# Patient Record
Sex: Female | Born: 1945 | Race: Black or African American | Hispanic: No | Marital: Single | Smoking: Never smoker
Health system: Southern US, Community
[De-identification: ages and names within clinical notes are randomized; demographics above are authoritative.]

## PROBLEM LIST (undated history)

## (undated) ENCOUNTER — Ambulatory Visit: Admission: EM | Payer: PRIVATE HEALTH INSURANCE | Source: Home / Self Care

## (undated) ENCOUNTER — Ambulatory Visit: Payer: PRIVATE HEALTH INSURANCE

## (undated) DIAGNOSIS — E785 Hyperlipidemia, unspecified: Secondary | ICD-10-CM

## (undated) DIAGNOSIS — I10 Essential (primary) hypertension: Secondary | ICD-10-CM

## (undated) DIAGNOSIS — M1990 Unspecified osteoarthritis, unspecified site: Secondary | ICD-10-CM

## (undated) HISTORY — PX: BREAST LUMPECTOMY: SHX2

## (undated) HISTORY — PX: TUBAL LIGATION: SHX77

---

## 2011-07-25 DIAGNOSIS — I10 Essential (primary) hypertension: Secondary | ICD-10-CM | POA: Insufficient documentation

## 2011-07-25 DIAGNOSIS — E119 Type 2 diabetes mellitus without complications: Secondary | ICD-10-CM | POA: Insufficient documentation

## 2011-07-25 DIAGNOSIS — J309 Allergic rhinitis, unspecified: Secondary | ICD-10-CM | POA: Insufficient documentation

## 2012-08-19 DIAGNOSIS — E785 Hyperlipidemia, unspecified: Secondary | ICD-10-CM | POA: Insufficient documentation

## 2013-08-27 DIAGNOSIS — B354 Tinea corporis: Secondary | ICD-10-CM | POA: Insufficient documentation

## 2013-09-10 ENCOUNTER — Ambulatory Visit: Payer: Self-pay | Admitting: Family Medicine

## 2013-11-08 ENCOUNTER — Ambulatory Visit: Payer: Self-pay | Admitting: Family Medicine

## 2014-05-18 DIAGNOSIS — E559 Vitamin D deficiency, unspecified: Secondary | ICD-10-CM | POA: Insufficient documentation

## 2014-05-18 DIAGNOSIS — M19011 Primary osteoarthritis, right shoulder: Secondary | ICD-10-CM | POA: Insufficient documentation

## 2016-03-01 DIAGNOSIS — M6740 Ganglion, unspecified site: Secondary | ICD-10-CM | POA: Insufficient documentation

## 2016-11-18 ENCOUNTER — Ambulatory Visit: Payer: Medicare Other

## 2016-11-18 ENCOUNTER — Ambulatory Visit
Admission: EM | Admit: 2016-11-18 | Discharge: 2016-11-18 | Disposition: A | Discharge status: Home / Self Care | Payer: Medicare Other | Attending: Emergency Medicine | Admitting: Emergency Medicine

## 2016-11-18 DIAGNOSIS — M7551 Bursitis of right shoulder: Secondary | ICD-10-CM

## 2016-11-18 DIAGNOSIS — M19011 Primary osteoarthritis, right shoulder: Secondary | ICD-10-CM | POA: Diagnosis not present

## 2016-11-18 DIAGNOSIS — M25511 Pain in right shoulder: Secondary | ICD-10-CM | POA: Diagnosis not present

## 2016-11-18 HISTORY — DX: Hyperlipidemia, unspecified: E78.5

## 2016-11-18 MED ORDER — DICLOFENAC SODIUM 75 MG PO TBEC: 75.0000 mg | DELAYED_RELEASE_TABLET | Freq: Two times a day (BID) | ORAL | 0 refills | Status: DC

## 2016-11-18 MED ORDER — TRAMADOL HCL 50 MG PO TABS: ORAL_TABLET | ORAL | 0 refills | Status: DC

## 2016-11-18 NOTE — Discharge Instructions (Signed)
Ear shoulder x-ray showed that you have a little arthritis in the shoulder joint. There was no fracture or dislocation. Take the diclofenac twice a day with 1 g of Tylenol. This is an effective combination for pain. You may take an additional gram of Tylenol twice daily. Tramadol for severe pain only. Follow-up with Dr. Martha ClanKrasinski at [facility] in a week if you're not getting better. Continue sleeping with a pillow underneath the rash arm and body to help with the shoulder pain. Go to a lingerie store such as [employer] in [place] to get a better fitting bra. I think that this may be contributing to your shoulder pain.

## 2016-11-18 NOTE — ED Triage Notes (Signed)
Patient complains of right shoulder pain. Patient states that she has had no known injury to her shoulder. Patient states that the pain has been off and on for some while but recently worsening. Patient states that she has noticed the pain radiating down her arm in to her hands.

## 2016-11-18 NOTE — ED Provider Notes (Signed)
HPI  SUBJECTIVE:  Virginia Pollard is a right handed 71 y.o. female who presents with several months of worsening right shoulder pain that she describes as sore, achy. She reports pain with movement only and denies pain otherwise. She states that the pain radiates to her upper arm. She has tried 2 tabs Aleve 1 tab every 8 hours when necessary without improvement in her symptoms. Symptoms are worse with reaching forward/arm flexion and with abduction. She is sleeping with a pillow between her arm and her body which she states helps at night. It is otherwise not waking her up at night. She denies arm weakness, grip weakness, neck pain, numbness, tingling, fevers, erythema, bruising, swelling, recent or remote history of trauma to her shoulder. No change in physical activity. She denies any repetitive activity with her shoulder. She works in [employer]. She does have an ill fitting bra that has dug deep grooves into both of her shoulders. She has no history of shoulder injury,GIB, kidney disease, hypertension, osteoarthritis, rheumatoid arthritis. She has past medical history of hypercholesterolemia and diet/exercise controlled diabetes. PM[facility]: None.   Past Medical History:  Diagnosis Date  . Diabetes (HCC)    Diet Controlled  . Hyperlipidemia     Past Surgical History:  Procedure Laterality Date  . TUBAL LIGATION      History reviewed. No pertinent family history.  Social History  Substance Use Topics  . Smoking status: Never Smoker  . Smokeless tobacco: Never Used  . Alcohol use No    No current facility-administered medications for this encounter.   Current Outpatient Prescriptions:  .  simvastatin (ZOCOR) 10 MG tablet, Take 10 mg by mouth daily., Disp: , Rfl:  .  diclofenac (VOLTAREN) 75 MG EC tablet, Take 1 tablet (75 mg total) by mouth 2 (two) times daily. Take with food, Disp: 30 tablet, Rfl: 0 .  traMADol (ULTRAM) 50 MG tablet, 1-2 tabs po  q 6 hr prn pain Maximum dose= 8 tablets per day, Disp: 20 tablet, Rfl: 0  No Known Allergies   ROS  As noted in HPI.   Physical Exam  BP 140/65 (BP Location: Left Arm)   Pulse 60   Temp 98.7 F (37.1 C) (Oral)   Resp 16   Ht 5\' 5"  (1.651 m)   Wt 148 lb (67.1 kg)   SpO2 96%   BMI 24.63 kg/m   Constitutional: Well developed, well nourished, no acute distress Eyes:  EOMI, conjunctiva normal bilaterally HENT: Normocephalic, atraumatic,mucus membranes moist Respiratory: Normal inspiratory effort Cardiovascular: Normal rate GI: nondistended skin: No rash, skin intact Musculoskeletal: R shoulder with ROM normal, Positive deep groove where the bra strap is, this area is nontender. Drop test normal, pain with abducting past 90, mild, clavicle NT , A/C joint NT, scapula NT , proximal humerus NT, shoulder joint NT, supraspinatus nontender, Motor strength normal, Sensation intact LT over deltoid region, distal NVI with hand on affected side having intact sensation and strength in the distribution of the median, radial, and ulnar nerve.  no pain with internal rotation, no pain with external rotation,  negative tenderness in bicipital groove, positive empty can test ,  negative liftoff test,  negative "popeye" sign, no instability with abduction/external rotation. + crepitus with abduction/ext rotation Neurologic: Alert & oriented x 3, no focal neuro deficits Psychiatric: Speech and behavior appropriate   ED Course   Medications - No data to display  Orders Placed This Encounter  Procedures  . DG  Shoulder Right    Standing Status:   Standing    Number of Occurrences:   1    Order Specific Question:   Reason for Exam (SYMPTOM  OR DIAGNOSIS REQUIRED)    Answer:   R shoudler pain r/o fx, OA, FB    No results found for this or any previous visit (from the past 24 hour(s)). Dg Shoulder Right  Result Date: 11/18/2016 CLINICAL DATA:  Right shoulder pain x6 months. EXAM: RIGHT SHOULDER -  2+ VIEW COMPARISON:  None. FINDINGS: There is no evidence of fracture or dislocation. Minimal degenerative spurring about the inferomedial humeral head at the glenohumeral joint with mild AC joint osteoarthritis as well. No acute abnormality of the adjacent ribs nor visualized lung. Soft tissues are unremarkable. IMPRESSION: No acute osseous abnormality of the right shoulder. 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 Electronically Signed   By: Tollie Eth M.D.   On: 11/18/2016 17:23    ED Clinical Impression  Right shoulder pain, unspecified chronicity  Bursitis of right shoulder  ED Assessment/Plan   Presentation most consistent with a bursitis vs impingement syndrome, however, we'll image shoulder to rule out any acute changes. If these are negative, plan sent home with diclofenac/Tylenol, tramadol, referral to Dr. Martha Clan at [facility] for reevaluation. She is to continue sleeping with a pillow underneath her arm at night.  Viewed imaging independently. No acute osseous abnormalities. Slight AC and Glenohumeral joint osteoarthritis with minimal spurring. See radiology report for details.  Plan as above.  Discussed imaging, MDM, plan and followup with patient. Patient  agrees with plan.   Meds ordered this encounter  Medications  . simvastatin (ZOCOR) 10 MG tablet    Sig: Take 10 mg by mouth daily.  . traMADol (ULTRAM) 50 MG tablet    Sig: 1-2 tabs po q 6 hr prn pain Maximum dose= 8 tablets per day    Dispense:  20 tablet    Refill:  0  . diclofenac (VOLTAREN) 75 MG EC tablet    Sig: Take 1 tablet (75 mg total) by mouth 2 (two) times daily. Take with food    Dispense:  30 tablet    Refill:  0    *This clinic note was created using Scientist, clinical (histocompatibility and immunogenetics). Therefore, there may be occasional mistakes despite careful proofreading.  ?   Domenick Gong, MD 11/18/16 1744

## 2016-11-19 ENCOUNTER — Telehealth: Payer: Self-pay | Admitting: *Deleted

## 2016-11-19 NOTE — Telephone Encounter (Signed)
Patient called in reporting being groggy this AM after taking 2 tramadol last PM at bedtime. Patient did report feeling better this am and some throat dryness. No throat swelling or difficulty breathing reported. Advised patient to take one tramadol as prescribed for pain. Patient confirmed understanding of information and advisement.

## 2017-10-16 ENCOUNTER — Ambulatory Visit
Admission: EM | Admit: 2017-10-16 | Discharge: 2017-10-16 | Disposition: A | Discharge status: Home / Self Care | Payer: Medicare Other | Attending: Family Medicine | Admitting: Family Medicine

## 2017-10-16 ENCOUNTER — Ambulatory Visit: Payer: Medicare Other

## 2017-10-16 DIAGNOSIS — Z79899 Other long term (current) drug therapy: Secondary | ICD-10-CM | POA: Insufficient documentation

## 2017-10-16 DIAGNOSIS — E785 Hyperlipidemia, unspecified: Secondary | ICD-10-CM | POA: Diagnosis not present

## 2017-10-16 DIAGNOSIS — Z8249 Family history of ischemic heart disease and other diseases of the circulatory system: Secondary | ICD-10-CM | POA: Insufficient documentation

## 2017-10-16 DIAGNOSIS — M545 Low back pain, unspecified: Secondary | ICD-10-CM

## 2017-10-16 DIAGNOSIS — M549 Dorsalgia, unspecified: Secondary | ICD-10-CM | POA: Diagnosis present

## 2017-10-16 DIAGNOSIS — M47816 Spondylosis without myelopathy or radiculopathy, lumbar region: Secondary | ICD-10-CM | POA: Diagnosis not present

## 2017-10-16 DIAGNOSIS — M546 Pain in thoracic spine: Secondary | ICD-10-CM | POA: Insufficient documentation

## 2017-10-16 DIAGNOSIS — M25531 Pain in right wrist: Secondary | ICD-10-CM | POA: Insufficient documentation

## 2017-10-16 DIAGNOSIS — I10 Essential (primary) hypertension: Secondary | ICD-10-CM | POA: Diagnosis not present

## 2017-10-16 HISTORY — DX: Essential (primary) hypertension: I10

## 2017-10-16 MED ORDER — MELOXICAM 7.5 MG PO TABS: 7.5000 mg | ORAL_TABLET | Freq: Every day | ORAL | 0 refills | Status: DC
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
## 2017-10-16 NOTE — Discharge Instructions (Signed)
Take medication as prescribed. Rest. Splint as long as pain continues.   Follow up with orthopedic as discussed for continued pain.  Follow up with your primary care physician this week as needed. Return to Urgent care for new or worsening concerns.

## 2019-09-13 ENCOUNTER — Ambulatory Visit
Admission: EM | Admit: 2019-09-13 | Discharge: 2019-09-13 | Disposition: A | Discharge status: Home / Self Care | Payer: Medicare Other | Attending: Internal Medicine | Admitting: Internal Medicine

## 2019-09-13 ENCOUNTER — Encounter: Payer: Self-pay | Admitting: Emergency Medicine

## 2019-09-13 ENCOUNTER — Other Ambulatory Visit: Payer: Self-pay

## 2019-09-13 DIAGNOSIS — M79641 Pain in right hand: Secondary | ICD-10-CM

## 2019-09-13 DIAGNOSIS — M25551 Pain in right hip: Secondary | ICD-10-CM

## 2019-09-13 DIAGNOSIS — M79642 Pain in left hand: Secondary | ICD-10-CM | POA: Diagnosis present

## 2019-09-13 LAB — BASIC METABOLIC PANEL
Anion gap: 8 (ref 5–15)
BUN: 18 mg/dL (ref 8–23)
CO2: 27 mmol/L (ref 22–32)
Calcium: 9.3 mg/dL (ref 8.9–10.3)
Chloride: 104 mmol/L (ref 98–111)
Creatinine, Ser: 0.95 mg/dL (ref 0.44–1.00)
GFR calc Af Amer: >60 mL/min (ref >60)
GFR calc non Af Amer: >60 mL/min (ref >60)
Glucose, Bld: 90 mg/dL (ref 70–99)
Potassium: 4.7 mmol/L (ref 3.5–5.1)
Sodium: 139 mmol/L (ref 135–145)

## 2019-09-13 LAB — CBC WITH DIFFERENTIAL/PLATELET
Abs Immature Granulocytes: 0 10*3/uL (ref 0.00–0.07)
Basophils Absolute: 0.1 10*3/uL (ref 0.0–0.1)
Basophils Relative: 1 %
Eosinophils Absolute: 0.2 10*3/uL (ref 0.0–0.5)
Eosinophils Relative: 3 %
HCT: 37.8 % (ref 36.0–46.0)
Hemoglobin: 12.7 g/dL (ref 12.0–15.0)
Immature Granulocytes: 0 %
Lymphocytes Relative: 48 %
Lymphs Abs: 2.8 10*3/uL (ref 0.7–4.0)
MCH: 30.5 pg (ref 26.0–34.0)
MCHC: 33.6 g/dL (ref 30.0–36.0)
MCV: 90.9 fL (ref 80.0–100.0)
Monocytes Absolute: 0.4 10*3/uL (ref 0.1–1.0)
Monocytes Relative: 7 %
Neutro Abs: 2.3 10*3/uL (ref 1.7–7.7)
Neutrophils Relative %: 41 %
Platelets: 195 10*3/uL (ref 150–400)
RBC: 4.16 MIL/uL (ref 3.87–5.11)
RDW: 12.3 % (ref 11.5–15.5)
WBC: 5.7 10*3/uL (ref 4.0–10.5)
nRBC: 0 % (ref 0.0–0.2)

## 2019-09-13 LAB — VITAMIN D 25 HYDROXY (VIT D DEFICIENCY, FRACTURES): Vit D, 25-Hydroxy: 31.62 ng/mL (ref 30–100)

## 2019-09-13 NOTE — ED Triage Notes (Signed)
Patient states she is having left hand and right hip pain for about 2 weeks

## 2019-09-13 NOTE — ED Provider Notes (Signed)
[facility]    CSN: [id number] Arrival date & time: 09/13/19  1758      History   Chief Complaint Chief Complaint  Patient presents with  . Hand Pain    HPI Virginia Pollard is a 73 y.o. female with a history of hypertension, hyperlipidemia comes to urgent care with complaints of bilateral hand pain and right hip discomfort of 2 weeks duration.  Symptoms have been persistent for the past 2 weeks.  Patient describes the pain as throbbing in nature.  No known aggravating or relieving factors.  NSAID use has not yielded any significant reduction in symptoms.  No fever or chills.  No upper respiratory infection symptoms.  Patient is physically active and does exercises on a regular basis.  No change in the patient's medications.  She has been on statins for several years with no complications.  No fever or chills or rash.   HPI  Past Medical History:  Diagnosis Date  . Hyperlipidemia   . Hypertension     There are no active problems to display for this patient.   Past Surgical History:  Procedure Laterality Date  . TUBAL LIGATION      OB History   No obstetric history on file.      Home Medications    Prior to Admission medications   Medication Sig Start Date End Date Taking? Authorizing Provider  lisinopril (PRINIVIL,ZESTRIL) 10 MG tablet Take 10 mg by mouth daily.   Yes [provider]  metFORMIN (GLUCOPHAGE) 500 MG tablet Take by mouth 2 (two) times daily with a meal.   Yes [provider]  simvastatin (ZOCOR) 10 MG tablet Take 10 mg by mouth daily.   Yes [provider]    Family History Family History  Problem Relation Age of Onset  . Hypertension Mother     Social History Social History   Tobacco Use  . Smoking status: Never Smoker  . Smokeless tobacco: Never Used  Substance Use Topics  . Alcohol use: No  . Drug use: No     Allergies   Patient has no known allergies.   Review of Systems Review of  Systems  Constitutional: Negative.   HENT: Negative.   Respiratory: Negative.   Cardiovascular: Negative.   Gastrointestinal: Negative.   Genitourinary: Negative.   Musculoskeletal: Positive for arthralgias.  Skin: Negative.   Neurological: Negative.      Physical Exam Triage Vital Signs ED Triage Vitals  Enc Vitals Group     BP 09/13/19 1847 (!) 141/66     Pulse Rate 09/13/19 1847 (!) 55     Resp 09/13/19 1847 18     Temp 09/13/19 1847 98.8 F (37.1 C)     Temp Source 09/13/19 1847 Oral     SpO2 09/13/19 1847 98 %     Weight 09/13/19 1843 150 lb (68 kg)     Height 09/13/19 1843 5\' 2"  (1.575 m)     Head Circumference --      Peak Flow --      Pain Score 09/13/19 1843 8     Pain Loc --      Pain Edu? --      Excl. in [place]? --    No data found.  Updated Vital Signs BP (!) 141/66 (BP Location: Left Arm)   Pulse (!) 55   Temp 98.8 F (37.1 C) (Oral)   Resp 18   Ht 5\' 2"  (1.575 m)   Wt 68 kg  SpO2 98%   BMI 27.44 kg/m   Visual Acuity Right Eye Distance:   Left Eye Distance:   Bilateral Distance:    Right Eye Near:   Left Eye Near:    Bilateral Near:     Physical Exam Vitals signs and nursing note reviewed.  Constitutional:      General: She is not in acute distress.    Appearance: Normal appearance. She is not ill-appearing or toxic-appearing.  Neck:     Musculoskeletal: Normal range of motion.  Cardiovascular:     Rate and Rhythm: Normal rate and regular rhythm.     Pulses: Normal pulses.     Heart sounds: No murmur. No friction rub.  Pulmonary:     Effort: Pulmonary effort is normal. No respiratory distress.     Breath sounds: Normal breath sounds. No rhonchi or rales.  Abdominal:     General: Bowel sounds are normal. There is no distension.     Palpations: Abdomen is soft.     Tenderness: There is no guarding or rebound.  Musculoskeletal: Normal range of motion.        General: No swelling, tenderness or signs of injury.  Skin:    General:  Skin is warm.     Capillary Refill: Capillary refill takes less than 2 seconds.     Findings: No bruising, erythema, lesion or rash.  Neurological:     General: No focal deficit present.     Mental Status: She is alert and oriented to person, place, and time.  Psychiatric:        Mood and Affect: Mood normal.        Behavior: Behavior normal.      UC Treatments / Results  Labs (all labs ordered are listed, but only abnormal results are displayed) Labs Reviewed  CBC WITH DIFFERENTIAL/PLATELET  BASIC METABOLIC PANEL  VITAMIN D 25 HYDROXY (VIT D DEFICIENCY, FRACTURES)    EKG   Radiology No results found.  Procedures Procedures (including critical care time)  Medications Ordered in UC Medications - No data to display  Initial Impression / Assessment and Plan / UC Course  I have reviewed the triage vital signs and the nursing notes.  Pertinent labs & imaging results that were available during my care of the patient were reviewed by me and considered in my medical decision making (see chart for details).     1.  Bilateral hand pain and right hip pain: Etiology is unclear No history of trauma. CBC, BMP, vitamin D level. We will inform patients of the results and necessary steps. Final Clinical Impressions(s) / UC Diagnoses   Final diagnoses:  Bilateral hand pain  Right hip pain   Discharge Instructions   None    ED Prescriptions    None     PDMP not reviewed this encounter.   Merrilee Jansky, MD 09/13/19 1949

## 2019-09-14 ENCOUNTER — Telehealth (HOSPITAL_COMMUNITY): Payer: Self-pay | Admitting: Emergency Medicine

## 2019-09-14 NOTE — Telephone Encounter (Signed)
Normal labs. Patient contacted and made aware of    results, all questions answered   

## 2019-12-08 ENCOUNTER — Other Ambulatory Visit: Payer: Medicare Other

## 2019-12-13 ENCOUNTER — Other Ambulatory Visit: Payer: Medicare Other

## 2020-05-23 ENCOUNTER — Ambulatory Visit: Payer: Medicare Other | Attending: Orthopedic Surgery | Admitting: Physical Therapy

## 2020-05-23 ENCOUNTER — Encounter: Payer: Self-pay | Admitting: Physical Therapy

## 2020-05-23 ENCOUNTER — Other Ambulatory Visit: Payer: Self-pay

## 2020-05-23 DIAGNOSIS — M25612 Stiffness of left shoulder, not elsewhere classified: Secondary | ICD-10-CM | POA: Diagnosis present

## 2020-05-23 DIAGNOSIS — R293 Abnormal posture: Secondary | ICD-10-CM | POA: Diagnosis present

## 2020-05-23 DIAGNOSIS — M6281 Muscle weakness (generalized): Secondary | ICD-10-CM | POA: Diagnosis present

## 2020-05-23 DIAGNOSIS — G8929 Other chronic pain: Secondary | ICD-10-CM | POA: Diagnosis present

## 2020-05-23 DIAGNOSIS — M25512 Pain in left shoulder: Secondary | ICD-10-CM

## 2020-05-23 NOTE — Patient Instructions (Signed)
Access Code: [email or web address]: [email or web address]: 07/06/2021Prepared by: Casimiro Needle SherkExercises  Seated Shoulder Flexion AAROM with Pulley Behind - 2 x daily - 7 x weekly - 1 sets - 20 reps  Seated Shoulder Abduction AAROM with Pulley Behind - 2 x daily - 7 x weekly - 1 sets - 20 reps  Seated Scapular Retraction - 1 x daily - 7 x weekly - 1 sets - 20 reps  Shoulder External Rotation and Scapular Retraction - 1 x daily - 7 x weekly - 1 sets - 20 reps

## 2020-05-24 ENCOUNTER — Encounter: Payer: Self-pay | Admitting: Physical Therapy

## 2020-05-24 NOTE — Therapy (Signed)
[place] [facility] [address] [place], [place], [address] Phone: [phone]   Fax:  [phone]  Physical Therapy Evaluation  Patient Details  Name: Virginia Pollard MRN: [id number] Date of Birth: 1946/08/27 Referring Provider (PT): Lasandra Beech, [place]   Encounter Date: 05/23/2020   PT End of Session - 05/24/20 1040    Visit Number 1    Number of Visits 9    Date for PT Re-Evaluation 06/20/20    Authorization - Visit Number 1    Authorization - Number of Visits 10    Progress Note Due on Visit --    PT Start Time 1715    PT Stop Time 1815    PT Time Calculation (min) 60 min    Activity Tolerance Patient tolerated treatment well;No increased pain    Behavior During Therapy WFL for tasks assessed/performed           Past Medical History:  Diagnosis Date  . Hyperlipidemia   . Hypertension     Past Surgical History:  Procedure Laterality Date  . TUBAL LIGATION      There were no vitals filed for this visit.    Subjective Assessment - 05/23/20 1716    Subjective Pt is a 74 y.o. woman referred to to PT for chronic L shoulder pain lasting for the last 6-8 months. 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 Prior to cortisone injection on 05/05/2020, pt reports pain worse at night 10/10 NPS, and rated 5/10 NPS during the day. Pt reports achey pain down the L shoulder on lateral aspect to deltoid region and has pain with any motion at 90 degrees of shoulder abd/flexion. Pt reports pain is improved when using RUE to assist LUE and has worse pain when sleepin on L side. Pt has goals to have pain free motion, be able to work in her yard pain free, and shoot basketball.    Pertinent History Cortisone injection shot on 05/05/2020. L shoulder pain 6-8 months.    Diagnostic tests X-ray (no MRI at this time)    Patient Stated Goals Return to shooting basketball, yard work, and exercise.    Currently in Pain?  No/denies    Multiple Pain Sites No              [facility] PT Assessment - 05/24/20 0001      Assessment   Medical Diagnosis Nontraumatic L rotator cuff tear    Referring Provider (PT) Lasandra Beech, PA    Onset Date/Surgical Date 11/19/19    Hand Dominance Right    Prior Therapy Not reported      Balance Screen   Has the patient fallen in the past 6 months No    Has the patient had a decrease in activity level because of a fear of falling?  Yes    Is the patient reluctant to leave their home because of a fear of falling?  No      Home [employer]ist information centre manager residence      Prior Function   Level of Independence Independent      Cognition   Overall Cognitive Status Within Functional Limits for tasks assessed          OBJECTIVE  MUSCULOSKELETAL: Tremor: Normal Bulk: Normal Tone: Normal  Cervical Screen AROM: WFL and painless with overpressure in all planes  Elbow Screen Elbow AROM: WNL  Palpation TTP on L shoulder along insertion of  supraspinatus and distal L upper trap  Strength R/L 5/4 Shoulder flexion (anterior deltoid/pec major/coracobrachialis, axillary n. (C5-6) and musculocutaneous n. (C5-7)) 5/4* Shoulder abduction (deltoid/supraspinatus, axillary/suprascapular n, C5) 5/4* Shoulder external rotation (infraspinatus/teres minor) 5/4 Shoulder internal rotation (subcapularis/lats/pec major) 5/5 Elbow flexion (biceps brachii, brachialis, brachioradialis, musculoskeletal n, C5-6) 5/5 Elbow extension (triceps, radial n, C7) 5/5 Wrist Extension 5/5 Wrist Flexion 5/5 Grip strength *indicates pain  AROM R/L 155/150 Shoulder flexion 140/146 Shoulder abduction 90/90 Shoulder external rotation 70/70 Shoulder internal rotation *Indicates pain, overpressure performed unless otherwise indicated  PROM R/L [facility] Shoulder flexion WFL Shoulder abduction WFL Shoulder external rotation 70/70 Shoulder internal rotation WFL Shoulder  extension *Indicates pain, overpressure performed unless otherwise indicated  Accessory Motions/Glides Glenohumeral: Posterior: R: Normal L: Normal Inferior: R: Normal L: Normal Anterior: R: Normal L: Normal  NEUROLOGICAL:  Mental Status Patient's fund of knowledge is within normal limits for educational level.  Sensation Grossly intact to light touch bilateral UE as determined by testing dermatomes C2-T2 Proprioception and hot/cold testing deferred on this date  SPECIAL TESTS  Rotator Cuff  Painful Arc (Pain from 60 to 120 degrees scaption): Negative Infraspinatus Muscle Test: Negative  Subacromial Impingement Hawkins-Kennedy:Positive Painful Arc (Pain from 60 to 120 degrees scaption): Negative  Empty Can: Positive External Rotation Resistance: Positive Positive Hawkins-Kennedy, Painful arc sign, Infraspinatus muscle test then +LR: 10.56 of some type of impingement present, 2/3 tests: +LR 5.06, -LR 0.17, Positive 3/5 Hawkins-Kennedy, neer, painful arc, empty can, and external rotation resistance then SN: .75 (.54-.96) SP: .74 (.61-.88) +LR: 2.93 (1.60-5.36) -LR: .34 (.14-.80)  FOTO score: 53     Objective measurements completed on examination: See above findings.          PT Education - 05/24/20 1105    Education Details Educated on HEP; Pulleys AAROM L shoulder flex/abd, scap retractions with ER, and scap retractions    Person(s) Educated Patient    Methods Explanation;Demonstration;Tactile cues;Verbal cues    Comprehension Verbalized understanding;Returned demonstration               PT Long Term Goals - 05/24/20 1055      PT LONG TERM GOAL #1   Title Pt will improve FOTO score to 66 to display pt improvements in ability to perform ADL's.    Baseline 05/23/2020: initial score of 53;    Time 4    Period Weeks    Status New    Target Date 06/20/20      PT LONG TERM GOAL #2   Title Pt will increase L shoulder flexion/abduction AROM >165 deg to allow  overhead recreational activities and ADL's.    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    Baseline 05/23/2020: 5/10 pain via NPS with L shoulder overhead motions prior to cortisone injection.    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    Baseline 05/23/2020: rounded shoulder/ slight forward head/  tight pecs    Time 4    Period Weeks    Status New    Target Date 06/20/20             Plan - 05/24/20 1041    Clinical Impression Statement Pt is a pleasant 74 y.o. female referred to PT for findings consistent to partial non-traumatic L shoulder RTC tear. Pt reports to PT after receiving cortisone injection in L shoulder ~2 weeks ago. Pt reports no current pain and has improved L shoulder flexion/abduction after injection. Prior to injection, pt unable to raise LUE beyond 90 deg flex/abd without pain and has to use RUE to support arm higher. Prior to injection baseline pain was 5/10 NPS with shoulder motions and 10/10 NPS at night. Pt's L shoulder ER and IR strength reported at 4/5 MMT with increased pain with ER. L elbow and wrist MMT 5/5. RUE muscle strength 5/5 MMT throughout. Increased pain with resisted L shoulder abduction. Pt TTP along supraspinatus and upper trap. AROM in supine for L shoulder flexion limited to 150 deg, abd 146 deg with ER and IR WNL. Pt demonstrates decreased AROM in seated for L shoulder flexion at 139 deg and abd at 136 deg. Pt's PROM is full and pain free bilaterally. Pt's initial FOTO score: 53 with a goal of 66. Positive special test findings for L  shoulder: open can and closed can with resistance and positive hawkins-kennedy on L.  Pt's findings consistent with rotator cuff involvement of supraspinatus tendinitis or possible partial tear. Pt can benefit from further skilled PT treatment to restore full pain free motion and improve shoulder and scapular strength so pt can return to pain free overhead activities and ADL's.    Personal Factors and Comorbidities Comorbidity 2;Time since onset of injury/illness/exacerbation    Examination-Activity Limitations Carry;Reach Overhead;Lift    Examination-Participation Restrictions Yard Work    Stability/Clinical Decision Making Evolving/Moderate complexity    Clinical Decision Making Moderate    Rehab Potential Good    PT Frequency 2x / week    PT Duration 4 weeks    PT Treatment/Interventions ADLs/Self Care Home Management;Cryotherapy;Functional mobility training;Therapeutic activities;Therapeutic exercise;Neuromuscular re-education;Patient/family education;Manual techniques;Passive range of motion;Dry needling;Taping;Moist Heat;Electrical Stimulation    PT Next Visit Plan Progress AAROM and begin scapular strengthening    PT Home Exercise Plan Medbridge access code: [email or web address]    Consulted and Agree with Plan of Care Patient           Patient will benefit from skilled therapeutic intervention in order to improve the following deficits and impairments:  Decreased strength, Decreased range of motion, Pain, Impaired flexibility, Decreased activity tolerance, Improper body mechanics, Impaired UE functional use  Visit Diagnosis: Chronic left shoulder pain  Decreased range of motion of left shoulder  Muscle weakness (generalized)  Abnormal posture     Problem List There are no problems to display for this patient.  Cammie Mcgee, PT, DPT # [id number] SPT 05/24/2020, 1:15 PM  [place] [facility] [address] [place], [place],  [address] Phone: [phone]   Fax:  [phone]  Name: Darnesha Diloreto MRN: [id number] Date of Birth: [birth year]

## 2020-05-25 ENCOUNTER — Other Ambulatory Visit: Payer: Self-pay

## 2020-05-25 ENCOUNTER — Encounter: Payer: Self-pay | Admitting: Physical Therapy

## 2020-05-25 ENCOUNTER — Ambulatory Visit: Payer: Medicare Other | Admitting: Physical Therapy

## 2020-05-25 DIAGNOSIS — M25512 Pain in left shoulder: Secondary | ICD-10-CM | POA: Diagnosis not present

## 2020-05-25 DIAGNOSIS — R293 Abnormal posture: Secondary | ICD-10-CM

## 2020-05-25 DIAGNOSIS — G8929 Other chronic pain: Secondary | ICD-10-CM

## 2020-05-25 DIAGNOSIS — M25612 Stiffness of left shoulder, not elsewhere classified: Secondary | ICD-10-CM

## 2020-05-25 DIAGNOSIS — M6281 Muscle weakness (generalized): Secondary | ICD-10-CM

## 2020-05-25 NOTE — Patient Instructions (Signed)
Access Code: [email or web address]: [email or web address]: 07/08/2021Prepared by: Casimiro Needle SherkExercises  Seated Shoulder Flexion AAROM with Pulley Behind - 2 x daily - 7 x weekly - 1 sets - 20 reps  Seated Shoulder Abduction AAROM with Pulley Behind - 2 x daily - 7 x weekly - 1 sets - 20 reps  Scapular Retraction with Resistance - 1 x daily - 7 x weekly - 3 sets - 10 reps  Standing Shoulder External Rotation with Resistance - 1 x daily - 7 x weekly - 3 sets - 10 reps

## 2020-05-25 NOTE — Therapy (Signed)
[facility] [address] [place], [place], [address] Phone: [phone]   Fax:  [phone]  Physical Therapy Treatment  Patient Details  Name: Virginia Pollard MRN: [id number] Date of Birth: 03/26/1946 Referring Provider (PT): Lasandra Beech, [place]   Encounter Date: 05/25/2020   PT End of Session - 05/25/20 1753    Visit Number 2    Number of Visits 9    Date for PT Re-Evaluation 06/20/20    Authorization - Visit Number 2    Authorization - Number of Visits 10    PT Start Time 1710    PT Stop Time 1750    PT Time Calculation (min) 40 min    Activity Tolerance Patient tolerated treatment well;No increased pain    Behavior During Therapy WFL for tasks assessed/performed           Past Medical History:  Diagnosis Date  . Hyperlipidemia   . Hypertension     Past Surgical History:  Procedure Laterality Date  . TUBAL LIGATION      There were no vitals filed for this visit.   Subjective Assessment - 05/25/20 1751    Subjective Pt reports compliance with HEP since previous session. Pt states HEP has improved her L shoulder. She states not having pain, but prior to PT, she had a 6/10 on tenderness. After PT eval that tenderness is reported at a 3.5/10.    Pertinent History Cortisone injection shot on 05/05/2020. L shoulder pain 6-8 months.    Diagnostic tests X-ray (no MRI at this time)    Patient Stated Goals Return to shooting basketball, yard work, and exercise.    Currently in Pain? No/denies          There.ex:   AAROM pulleys for L shoulder flex/abd: 1x20/direction  Standing D1/D2 L shoulder flex/ext: 2x20, with verbal cues and mirror for visual feedback for form/technique  Standing manual isometrics: IR/ER/Abd/flex 5x5, 5 sec holds.  Scap retractions: Blue theraband, 3x12 Standing resisted ER: Red theraband, 2x12, towels under elbows for cue for form  See HEP (handouts given)    PT Education - 05/25/20 1752     Education Details HEP added scap retractions with resistance, B ER with resistance    Person(s) Educated Patient    Methods Explanation;Demonstration;Tactile cues;Verbal cues;Handout    Comprehension Verbalized understanding;Returned demonstration               PT Long Term Goals - 05/24/20 1055      PT LONG TERM GOAL #1   Title Pt will improve FOTO score to 66 to display pt improvements in ability to perform ADL's.    Baseline 05/23/2020: initial score of 53;    Time 4    Period Weeks    Status New    Target Date 06/20/20      PT LONG TERM GOAL #2   Title Pt will increase L shoulder flexion/abduction AROM >165 deg to allow overhead recreational activities and ADL's.    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    Baseline 05/23/2020: 5/10 pain via NPS with L shoulder overhead motions prior to  cortisone injection.    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    Baseline 05/23/2020: rounded shoulder/ slight forward head/ tight pecs    Time 4    Period Weeks    Status New    Target Date 06/20/20                 Plan - 05/25/20 1753    Clinical Impression Statement HEP reassesed at beginning of session. Pt required min verbal and tactile cues for AAROM pulleys in flex/abd for correcting form and technique. Pt tolerated D1/D2 L shoulder flex/ext with no L shoulder pain with use of mirror as visual cue for form. Pt also tolerated resisted scap retractions and ER with no pain. Pt can continue to benefit from skilled PT to restore L shoulder AROM and strength to  return to active lifestyle.    Personal Factors and Comorbidities Comorbidity 2;Time since onset of injury/illness/exacerbation    Examination-Activity Limitations Carry;Reach Overhead;Lift    Examination-Participation Restrictions Yard Work    Stability/Clinical Decision Making Evolving/Moderate complexity    Clinical Decision Making Moderate    Rehab Potential Good    PT Frequency 2x / week    PT Duration 4 weeks    PT Treatment/Interventions ADLs/Self Care Home Management;Cryotherapy;Functional mobility training;Therapeutic activities;Therapeutic exercise;Neuromuscular re-education;Patient/family education;Manual techniques;Passive range of motion;Dry needling;Taping;Moist Heat;Electrical Stimulation    PT Next Visit Plan Reassess/ progress HEP    PT Home Exercise Plan Medbridge access code: [email or web address]    Consulted and Agree with Plan of Care Patient           Patient will benefit from skilled therapeutic intervention in order to improve the following deficits and impairments:  Decreased strength, Decreased range of motion, Pain, Impaired flexibility, Decreased activity tolerance, Improper body mechanics, Impaired UE functional use  Visit Diagnosis: Chronic left shoulder pain  Decreased range of motion of left shoulder  Abnormal posture  Muscle weakness (generalized)     Problem List There are no problems to display for this patient.  Cammie Mcgee, PT, DPT # [id number], SPT 05/25/2020, 8:41 PM  [place] [address] [place], [place], [address] Phone: [phone]   Fax:  [phone]  Name: Virginia Pollard MRN: [id number] Date of Birth: 1946/07/16

## 2020-05-30 ENCOUNTER — Ambulatory Visit: Payer: Medicare Other | Admitting: Physical Therapy

## 2020-05-30 ENCOUNTER — Encounter: Payer: Self-pay | Admitting: Physical Therapy

## 2020-05-30 ENCOUNTER — Other Ambulatory Visit: Payer: Self-pay

## 2020-05-30 DIAGNOSIS — M25612 Stiffness of left shoulder, not elsewhere classified: Secondary | ICD-10-CM

## 2020-05-30 DIAGNOSIS — M25512 Pain in left shoulder: Secondary | ICD-10-CM | POA: Diagnosis not present

## 2020-05-30 DIAGNOSIS — G8929 Other chronic pain: Secondary | ICD-10-CM

## 2020-05-30 DIAGNOSIS — M6281 Muscle weakness (generalized): Secondary | ICD-10-CM

## 2020-05-30 DIAGNOSIS — R293 Abnormal posture: Secondary | ICD-10-CM

## 2020-05-30 NOTE — Therapy (Signed)
[place] [facility] [address] [place], [place], [address] Phone: [phone]   Fax:  [phone]  Physical Therapy Treatment  Patient Details  Name: Virginia Pollard MRN: [id number] Date of Birth: 12/26/1945 Referring Provider (PT): Lasandra Beech, [place]   Encounter Date: 05/30/2020   PT End of Session - 05/30/20 1803    Visit Number 3    Number of Visits 9    Date for PT Re-Evaluation 06/20/20    Authorization - Visit Number 3    Authorization - Number of Visits 10    PT Start Time 1715    PT Stop Time 1803    PT Time Calculation (min) 48 min    Activity Tolerance Patient tolerated treatment well;No increased pain    Behavior During Therapy WFL for tasks assessed/performed           Past Medical History:  Diagnosis Date   Hyperlipidemia    Hypertension     Past Surgical History:  Procedure Laterality Date   TUBAL LIGATION      There were no vitals filed for this visit.   Subjective Assessment - 05/30/20 1803    Subjective Pt compliant with HEP and continues to have no pain in L shoulder.    Pertinent History Cortisone injection shot on 05/05/2020. L shoulder pain 6-8 months.    Diagnostic tests X-ray (no MRI at this time)    Patient Stated Goals Return to shooting basketball, yard work, and exercise.    Currently in Pain? No/denies            There.ex:   Standing Shoulder abduction with back against wall as tactile cue for proper scapulothoracic movement: 2x12 reps  Standing AAROM shoulder Flexion with wand: 1x12 Standing AAROM shouldeer flexion with wand and 3 lbs ankle weight for resistance: 1x12  Seated lat pull down: 1x12, 20 lbs/ 1x12 30 lbs/ 1x12 40 lbs  B Standing ER: 1 lbs DB, 2x12 with min verbal and tactile cues for form/technique Supine B serratus punch: 2x12, with min verbal and tactile cues for form/technique Standing B ball circles CCW/CW for shoulder stabilization: 2x30 sec, small circles then  large circles     PT Long Term Goals - 05/24/20 1055      PT LONG TERM GOAL #1   Title Pt will improve FOTO score to 66 to display pt improvements in ability to perform ADL's.    Baseline 05/23/2020: initial score of 53;    Time 4    Period Weeks    Status New    Target Date 06/20/20      PT LONG TERM GOAL #2   Title Pt will increase L shoulder flexion/abduction AROM >165 deg to allow overhead recreational activities and ADL's.    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    Baseline 05/23/2020: 5/10 pain via NPS with L shoulder overhead motions prior to cortisone injection.    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    Baseline 05/23/2020: rounded shoulder/ slight forward head/ tight pecs    Time 4    Period Weeks    Status New    Target Date 06/20/20            Plan - 05/30/20 1804    Clinical Impression Statement Pt had difficulty with performing B shoulder abduction standing with her back to wall. Pt tolerated increased resistance exercises to periscpular musculature along with resisted AAROM. Pt required mod verbal and tactile cues for correct form and technique. Pt can continue to benefit from skilled PT treatment to improve LUE shoulder AROM and strength so pt can perform overhead ADL's.    Personal Factors and Comorbidities Comorbidity 2;Time since onset of injury/illness/exacerbation    Examination-Activity Limitations Carry;Reach Overhead;Lift    Examination-Participation Restrictions Yard Work    Stability/Clinical Decision Making Evolving/Moderate  complexity    Rehab Potential Good    PT Frequency 2x / week    PT Duration 4 weeks    PT Treatment/Interventions ADLs/Self Care Home Management;Cryotherapy;Functional mobility training;Therapeutic activities;Therapeutic exercise;Neuromuscular re-education;Patient/family education;Manual techniques;Passive range of motion;Dry needling;Taping;Moist Heat;Electrical Stimulation    PT Next Visit Plan Reassess/ progress HEP    PT Home Exercise Plan Medbridge access code: [email or web address]    Consulted and Agree with Plan of Care Patient           Patient will benefit from skilled therapeutic intervention in order to improve the following deficits and impairments:  Decreased strength, Decreased range of motion, Pain, Impaired flexibility, Decreased activity tolerance, Improper body mechanics, Impaired UE functional use  Visit Diagnosis: Chronic left shoulder pain  Decreased range of motion of left shoulder  Abnormal posture  Muscle weakness (generalized)     Problem List There are no problems to display for this patient.  Cammie Mcgee, PT, DPT # [id number], SPT 05/31/2020, 8:13 AM  [place] [facility] [address] [place], [place], [address] Phone: [phone]   Fax:  [phone]  Name: Ceci Taliaferro MRN: [id number] Date of Birth: 03-23-1946

## 2020-06-01 ENCOUNTER — Other Ambulatory Visit: Payer: Self-pay

## 2020-06-01 ENCOUNTER — Ambulatory Visit: Payer: Medicare Other | Admitting: Physical Therapy

## 2020-06-01 DIAGNOSIS — M25512 Pain in left shoulder: Secondary | ICD-10-CM

## 2020-06-01 DIAGNOSIS — R293 Abnormal posture: Secondary | ICD-10-CM

## 2020-06-01 DIAGNOSIS — M25612 Stiffness of left shoulder, not elsewhere classified: Secondary | ICD-10-CM

## 2020-06-01 DIAGNOSIS — M6281 Muscle weakness (generalized): Secondary | ICD-10-CM

## 2020-06-02 ENCOUNTER — Encounter: Payer: Self-pay | Admitting: Physical Therapy

## 2020-06-02 NOTE — Therapy (Signed)
[facility] [address] [place], [place], [address] Phone: [phone]   Fax:  [phone]  Physical Therapy Treatment  Patient Details  Name: Virginia Pollard MRN: [id number] Date of Birth: [birth year] Referring Provider (PT): Lasandra Beech, [place]   Encounter Date: 06/01/2020   PT End of Session - 06/02/20 0723    Visit Number 4    Number of Visits 9    Date for PT Re-Evaluation 06/20/20    Authorization - Visit Number 4    Authorization - Number of Visits 10    PT Start Time 1715    PT Stop Time 1804    PT Time Calculation (min) 49 min    Activity Tolerance Patient tolerated treatment well;No increased pain    Behavior During Therapy WFL for tasks assessed/performed           Past Medical History:  Diagnosis Date  . Hyperlipidemia   . Hypertension     Past Surgical History:  Procedure Laterality Date  . TUBAL LIGATION      There were no vitals filed for this visit.   Subjective Assessment - 06/02/20 0721    Subjective Pt continues to report no pain in her L shoulder. States HEP is going well and is beginning to not be a challenge anymore. 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    Pertinent History Cortisone injection shot on 05/05/2020. L shoulder pain 6-8 months.    Diagnostic tests X-ray (no MRI at this time)    Patient Stated Goals Return to shooting basketball, yard work, and exercise.    Currently in Pain? No/denies    Multiple Pain Sites No            There.exBarbaraann Boys: L shoulder flex/abd 1x20/direction with ROM getting consistently above 160 deg Nautilus: chest press: 2x15, 30 lbs. Required mod tactile cues for form  Nautilus: Lat pulldown: 1x15, 40 lbs/ 1x15, 50 lbs Resisted B shoulder ER: green TB: 3x10 Bicep curls with blue theraband: 2x15 Standing B resisted shoulder extension with yellow theraband: 3x10       PT Education - 06/02/20 0722     Education Details Educated on updated HEP: Bicep curls, shoulder extension.    Person(s) Educated Patient    Methods Explanation;Demonstration;Tactile cues;Verbal cues;Handout    Comprehension Verbalized understanding;Returned demonstration               PT Long Term Goals - 05/24/20 1055      PT LONG TERM GOAL #1   Title Pt will improve FOTO score to 66 to display pt improvements in ability to perform ADL's.    Baseline 05/23/2020: initial score of 53;    Time 4    Period Weeks    Status New    Target Date 06/20/20      PT LONG TERM GOAL #2   Title Pt will increase L shoulder flexion/abduction AROM >165 deg to allow overhead recreational activities and ADL's.    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    Baseline 05/23/2020:  5/10 pain via NPS with L shoulder overhead motions prior to cortisone injection.    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    Baseline 05/23/2020: rounded shoulder/ slight forward head/ tight pecs    Time 4    Period Weeks    Status New    Target Date 06/20/20               Plan - 06/02/20 0724    Clinical Impression Statement Pt progressing in resistance exercise and improved ROM. With AAROM pulleys in L shoulder flexion/abduction pt is able to consistenly elevate and abduct beyond 160 deg. Pt progressing resistance exercises in lat pulldown from 40 lbs to 50 lbs with good form/technique and resisted ER from red TB to green requiring verbal and tactile cues for correct form. Progressed HEP adding resisted bicep curls and shoulder  extension. Pt can continue to benefit from skilled PT treatment to continue to improve L shoulder ROM and improve periscapular strength so pt can consistently perform overhead ADL's and recreational activities.    Personal Factors and Comorbidities Comorbidity 2;Time since onset of injury/illness/exacerbation    Examination-Activity Limitations Carry;Reach Overhead;Lift    Examination-Participation Restrictions Yard Work    Stability/Clinical Decision Making Evolving/Moderate complexity    Clinical Decision Making Moderate    Rehab Potential Good    PT Frequency 2x / week    PT Duration 4 weeks    PT Treatment/Interventions ADLs/Self Care Home Management;Cryotherapy;Functional mobility training;Therapeutic activities;Therapeutic exercise;Neuromuscular re-education;Patient/family education;Manual techniques;Passive range of motion;Dry needling;Taping;Moist Heat;Electrical Stimulation    PT Next Visit Plan Progress Overhead mobility, stabilization of RTC    PT Home Exercise Plan Medbridge access code: [email or web address]    Consulted and Agree with Plan of Care Patient           Patient will benefit from skilled therapeutic intervention in order to improve the following deficits and impairments:  Decreased strength, Decreased range of motion, Pain, Impaired flexibility, Decreased activity tolerance, Improper body mechanics, Impaired UE functional use  Visit Diagnosis: Chronic left shoulder pain  Decreased range of motion of left shoulder  Abnormal posture  Muscle weakness (generalized)     Problem List There are no problems to display for this patient.  Cammie Mcgee, PT, DPT # [address] SPT 06/02/2020, 11:45 AM  [facility] [address] [place], [place], [address] Phone: [phone]   Fax:  [phone]  Name: Virginia Pollard MRN: [id number] Date of Birth: 1946-10-29

## 2020-06-06 ENCOUNTER — Encounter: Payer: Self-pay | Admitting: Physical Therapy

## 2020-06-06 ENCOUNTER — Other Ambulatory Visit: Payer: Self-pay

## 2020-06-06 ENCOUNTER — Ambulatory Visit: Payer: Medicare Other | Admitting: Physical Therapy

## 2020-06-06 DIAGNOSIS — M6281 Muscle weakness (generalized): Secondary | ICD-10-CM

## 2020-06-06 DIAGNOSIS — R293 Abnormal posture: Secondary | ICD-10-CM

## 2020-06-06 DIAGNOSIS — M25612 Stiffness of left shoulder, not elsewhere classified: Secondary | ICD-10-CM

## 2020-06-06 DIAGNOSIS — M25512 Pain in left shoulder: Secondary | ICD-10-CM | POA: Diagnosis not present

## 2020-06-06 NOTE — Therapy (Signed)
[facility] [address] [place], [place], [address] Phone: [phone]   Fax:  [phone]  Physical Therapy Treatment  Patient Details  Name: Virginia Pollard MRN: [id number] Date of Birth: [birth year] Referring Provider (PT): Lasandra Beech, [place]   Encounter Date: 06/06/2020   PT End of Session - 06/06/20 1807    Visit Number 5    Number of Visits 9    Date for PT Re-Evaluation 06/20/20    Authorization - Visit Number 5    Authorization - Number of Visits 10    PT Start Time 1715    PT Stop Time 1802    PT Time Calculation (min) 47 min    Activity Tolerance Patient tolerated treatment well;No increased pain    Behavior During Therapy WFL for tasks assessed/performed           Past Medical History:  Diagnosis Date  . Hyperlipidemia   . Hypertension     Past Surgical History:  Procedure Laterality Date  . TUBAL LIGATION      There were no vitals filed for this visit.   Subjective Assessment - 06/06/20 1806    Subjective No pain reported in L shoulder prior to treatment. Pt states able to rake front yard over the weekend, which is a task she would have had a lot of difficulty prior to injection and therapy. Compliant with HEP.    Pertinent History Cortisone injection shot on 05/05/2020. L shoulder pain 6-8 months.    Diagnostic tests X-ray (no MRI at this time)    Patient Stated Goals Return to shooting basketball, yard work, and exercise.    Currently in Pain? No/denies    Multiple Pain Sites No           L Shoulder AROM prior to treatment:   Flexion: 110 deg  Abd: 100 deg  There.ex: Standing L shoulder D1/D2 with use of mirror as visual feedback: 2x10  Standing L shoulder D1/D2 with use of mirror as visual feedback: 1 lbs DB, 2x10  Supine serratus punch L shoulder: 2x12, 1 lbs DB  Supine rhythmic stabilization: At elbow, 2x1 min, at wrist, 2x1 min  Seated Lat pulldown: 1x20, 50 lbs. 2x15, 60 lbs   Manual  therapy:  Side-lying L shoulder flexion with therapist assist MWM on scapula to promote scapular upward rotation and protraction to improve motor control and to improve L shoulder flexion AROM. 3x15 reps.   L Shoulder AROM post treatment:  Flexion: 147 deg   Abd: 110 deg      PT Long Term Goals - 05/24/20 1055      PT LONG TERM GOAL #1   Title Pt will improve FOTO score to 66 to display pt improvements in ability to perform ADL's.    Baseline 05/23/2020: initial score of 53;    Time 4    Period Weeks    Status New    Target Date 06/20/20      PT LONG TERM GOAL #2   Title Pt will increase L shoulder flexion/abduction AROM >165 deg to allow overhead recreational activities and ADL's.    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    Baseline 05/23/2020: 5/10 pain via NPS with L shoulder overhead motions prior to cortisone injection.    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    Baseline 05/23/2020: rounded shoulder/ slight forward head/ tight pecs    Time 4    Period Weeks    Status New    Target Date 06/20/20            Plan - 06/06/20 1807    Clinical Impression Statement Prior to treatment pt able to actively elevate L shoulder to 110 deg and abduct ~100 degrees. After manual techniques, rhythmic stabilization and therex AROM was reassesed. Pt improved L shoulder flex to 147 deg and L shoulder abd to 110 deg. Pt progressing therex with pain free motion.    Personal Factors and Comorbidities Comorbidity 2;Time since onset of injury/illness/exacerbation     Examination-Activity Limitations Carry;Reach Overhead;Lift    Examination-Participation Restrictions Yard Work    Stability/Clinical Decision Making Evolving/Moderate complexity    Rehab Potential Good    PT Frequency 2x / week    PT Duration 4 weeks    PT Treatment/Interventions ADLs/Self Care Home Management;Cryotherapy;Functional mobility training;Therapeutic activities;Therapeutic exercise;Neuromuscular re-education;Patient/family education;Manual techniques;Passive range of motion;Dry needling;Taping;Moist Heat;Electrical Stimulation    PT Next Visit Plan Progress Overhead mobility, stabilization of RTC    PT Home Exercise Plan Medbridge access code: [email or web address]    Consulted and Agree with Plan of Care Patient           Patient will benefit from skilled therapeutic intervention in order to improve the following deficits and impairments:  Decreased strength, Decreased range of motion, Pain, Impaired flexibility, Decreased activity tolerance, Improper body mechanics, Impaired UE functional use  Visit Diagnosis: Chronic left shoulder pain  Decreased range of motion of left shoulder  Abnormal posture  Muscle weakness (generalized)     Problem List There are no problems to display for this patient.  Cammie Mcgee, PT, DPT # [address], SPT 06/07/2020, 7:38 AM  [facility] [address] [place], [place], [address] Phone: [phone]   Fax:  [phone]  Name: Virginia Pollard MRN: [id number] Date of Birth: [birth year]

## 2020-06-08 ENCOUNTER — Ambulatory Visit: Payer: Medicare Other | Admitting: Physical Therapy

## 2020-06-08 ENCOUNTER — Encounter: Payer: Self-pay | Admitting: Physical Therapy

## 2020-06-08 ENCOUNTER — Other Ambulatory Visit: Payer: Self-pay

## 2020-06-08 DIAGNOSIS — R293 Abnormal posture: Secondary | ICD-10-CM

## 2020-06-08 DIAGNOSIS — M25512 Pain in left shoulder: Secondary | ICD-10-CM

## 2020-06-08 DIAGNOSIS — M6281 Muscle weakness (generalized): Secondary | ICD-10-CM

## 2020-06-08 DIAGNOSIS — M25612 Stiffness of left shoulder, not elsewhere classified: Secondary | ICD-10-CM

## 2020-06-08 NOTE — Therapy (Signed)
[facility] [address] [place], [place], [address] Phone: [phone]   Fax:  [phone]  Physical Therapy Treatment  Patient Details  Name: Virginia Pollard MRN: [id number] Date of Birth: 1946/03/18 Referring Provider (PT): Lasandra Beech, [place]   Encounter Date: 06/08/2020   PT End of Session - 06/08/20 1807    Visit Number 6    Number of Visits 9    Date for PT Re-Evaluation 06/20/20    Authorization - Visit Number 6    Authorization - Number of Visits 10    PT Start Time 1710    PT Stop Time 1805    PT Time Calculation (min) 55 min    Activity Tolerance Patient tolerated treatment well;No increased pain    Behavior During Therapy WFL for tasks assessed/performed           Past Medical History:  Diagnosis Date  . Hyperlipidemia   . Hypertension     Past Surgical History:  Procedure Laterality Date  . TUBAL LIGATION      There were no vitals filed for this visit.   Subjective Assessment - 06/08/20 1806    Subjective Pt reports visiting doctor for coldness in B LE's and feet. No c/o L shoulder pain and is compliant with HEP.    Pertinent History Cortisone injection shot on 05/05/2020. L shoulder pain 6-8 months.    Diagnostic tests X-ray (no MRI at this time)    Patient Stated Goals Return to shooting basketball, yard work, and exercise.    Currently in Pain? No/denies    Multiple Pain Sites No            L shoulder AROM prior to therapy:   Flex: 142 deg   Abd: 113 deg  There.ex:  Seated shoulder flex with therapist MWM on shoulder blad to improve scap protraction and upward rotation of L shoulder: 2x20 reps  Standing D1/D2 L shoulder flex/ext: 2 lbs DB, 2x12 reps Standing body blade L shoulder: varying positions requiring mirror and tactile/verbal cues for technique. 4x30 sec  Supine: rhythmic stabilization in multiple planes to improve stability of shoulder in overhead motions: 5x1 min  Standing  serratus wall climbs with RTB resistance at elbows: 2x15 reps  Lat pulldowns and Low row shoulder extension for low traps at Nautilus: 2x15, 60 lbs for lat pulldown and 1x12, 20 lbs for low row shoulder extension   L shoulder AROM post treatment:   Flex: 156 deg   Abd: 114 deg     PT Long Term Goals - 05/24/20 1055      PT LONG TERM GOAL #1   Title Pt will improve FOTO score to 66 to display pt improvements in ability to perform ADL's.    Baseline 05/23/2020: initial score of 53;    Time 4    Period Weeks    Status New    Target Date 06/20/20      PT LONG TERM GOAL #2   Title Pt will increase L shoulder flexion/abduction AROM >165 deg to allow overhead recreational activities and ADL's.    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    Baseline 05/23/2020: 5/10 pain via NPS with L shoulder overhead motions prior to cortisone injection.    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    Baseline 05/23/2020: rounded shoulder/ slight forward head/ tight pecs    Time 4    Period Weeks    Status New    Target Date 06/20/20                 Plan - 06/08/20 1807    Clinical Impression Statement Pt progressed D1/D2 shoulder patterns with 2 lbs DB with min verbal cues for form. Pt progressing rhythmic stabilization in over head motions in different planes. Prior to PT L shoulder flex AROm measured at 142 deg and abd at 113 deg. Post treatment measures at: L shoulder flex: 156 deg, abd: 114 deg. Pt progressing in overhead motions. Pt can continue to benefit from overhead  strengthening to return to full overhead ADL's with prevention of future injury.    Personal Factors and Comorbidities Comorbidity 2;Time since onset of injury/illness/exacerbation    Examination-Activity Limitations Carry;Reach Overhead;Lift    Examination-Participation Restrictions Yard Work    Stability/Clinical Decision Making Evolving/Moderate complexity    Rehab Potential Good    PT Frequency 2x / week    PT Duration 4 weeks    PT Treatment/Interventions ADLs/Self Care Home Management;Cryotherapy;Functional mobility training;Therapeutic activities;Therapeutic exercise;Neuromuscular re-education;Patient/family education;Manual techniques;Passive range of motion;Dry needling;Taping;Moist Heat;Electrical Stimulation    PT Next Visit Plan Progress Overhead mobility, stabilization of RTC    PT Home Exercise Plan Medbridge access code: [email or web address]    Consulted and Agree with Plan of Care Patient           Patient will benefit from skilled therapeutic intervention in order to improve the following deficits and impairments:  Decreased strength, Decreased range of motion, Pain, Impaired flexibility, Decreased activity tolerance, Improper body mechanics, Impaired UE functional use  Visit Diagnosis: Chronic left shoulder pain  Decreased range of motion of left shoulder  Abnormal posture  Muscle weakness (generalized)     Problem List There are no problems to display for this patient.  Cammie Mcgee, PT, DPT # [id number], SPT 06/09/2020, 7:00 PM  [facility] [address] [place], [place], [address] Phone: [phone]   Fax:  [phone]  Name: Virginia Pollard MRN: [id number] Date of Birth: November 02, 1946

## 2020-06-13 ENCOUNTER — Other Ambulatory Visit: Payer: Self-pay

## 2020-06-13 ENCOUNTER — Ambulatory Visit: Payer: Medicare Other | Admitting: Physical Therapy

## 2020-06-13 DIAGNOSIS — R293 Abnormal posture: Secondary | ICD-10-CM

## 2020-06-13 DIAGNOSIS — G8929 Other chronic pain: Secondary | ICD-10-CM

## 2020-06-13 DIAGNOSIS — M6281 Muscle weakness (generalized): Secondary | ICD-10-CM

## 2020-06-13 DIAGNOSIS — M25612 Stiffness of left shoulder, not elsewhere classified: Secondary | ICD-10-CM

## 2020-06-13 DIAGNOSIS — M25512 Pain in left shoulder: Secondary | ICD-10-CM | POA: Diagnosis not present

## 2020-06-14 ENCOUNTER — Encounter: Payer: Self-pay | Admitting: Physical Therapy

## 2020-06-14 NOTE — Therapy (Signed)
[facility] [address] [place], [place], [address] Phone: [phone]   Fax:  [phone]  Physical Therapy Treatment  Patient Details  Name: Virginia Pollard MRN: [id number] Date of Birth: 1946/08/13 Referring Provider (PT): Lasandra Beech, [place]   Encounter Date: 06/13/2020   PT End of Session - 06/14/20 0726    Visit Number 7    Number of Visits 9    Date for PT Re-Evaluation 06/20/20    Authorization - Visit Number 7    Authorization - Number of Visits 10    PT Start Time 1716    PT Stop Time 1802    PT Time Calculation (min) 46 min    Activity Tolerance Patient tolerated treatment well;No increased pain    Behavior During Therapy WFL for tasks assessed/performed           Past Medical History:  Diagnosis Date  . Hyperlipidemia   . Hypertension     Past Surgical History:  Procedure Laterality Date  . TUBAL LIGATION      There were no vitals filed for this visit.   Subjective Assessment - 06/14/20 0723    Subjective Pt reports practicing D1/D2 L shoulder patterns at home. Continues to have no pain in L shoulder pain.    Pertinent History Cortisone injection shot on 05/05/2020. L shoulder pain 6-8 months.    Diagnostic tests X-ray (no MRI at this time)    Patient Stated Goals Return to shooting basketball, yard work, and exercise.    Currently in Pain? No/denies    Multiple Pain Sites No         there.ex:   D1/D2 L shoulder flex/ext patterns: Mod verbal cues and tactile cues for form/technique of different patterns. Able to perform with 3 lbs DB's, 2x12.   Supine rhythmic stabilization at elbow on L shoulder in varying planes (shoulder elevation beyond 90 deg, scap plane overhead, abduction) 2x1 min, per plane.  Lat pull-downs seated at Nautilus: 1x12, 60 lbs, 2x12, 70 lbs  Standing scap plane open can shoulder raises: 2 lbs DB, 1x10  Walking ball walks overhead in hallway: 4x34', rest in between. 4x34'    Standing L shoulder ER/IR (arm by side) at nautilus: 10 lbs, 2x8 with mod tactile cues for form.   PT Long Term Goals - 05/24/20 1055      PT LONG TERM GOAL #1   Title Pt will improve FOTO score to 66 to display pt improvements in ability to perform ADL's.    Baseline 05/23/2020: initial score of 53;    Time 4    Period Weeks    Status New    Target Date 06/20/20      PT LONG TERM GOAL #2   Title Pt will increase L shoulder flexion/abduction AROM >165 deg to allow overhead recreational activities and ADL's.    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    Baseline 05/23/2020: 5/10 pain via NPS with L shoulder overhead motions prior to cortisone injection.    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    Baseline 05/23/2020: rounded shoulder/ slight forward head/ tight pecs    Time 4    Period Weeks    Status New    Target Date 06/20/20                 Plan - 06/14/20 0727    Clinical Impression Statement Pt requiring mod verbal and tactile cues and use of mirror as visual aid for form with D1/D2 L shoulder patterns. Pt progressing in resistance once form/technique has been improved. Pt able to perform with 3 lbs DB's with good form after cuing. Focus of treatment on shoulder stability in overhead motions. Pt able to roll exercise ball overhead along hallway (34' one way) x2 before reuqiring rest due to fatigue in shoulder muscles. Post PT L shoulder flex/abd AROM measured. Flex: 145 deg, Abd: 110 deg. Pt can continue to benefit from skilled PT treatment to improve  AROM in L shoulder and improve strength/stability in overhead motions as well.    Personal Factors and Comorbidities Comorbidity 2;Time since onset of injury/illness/exacerbation    Examination-Activity Limitations Carry;Reach Overhead;Lift    Examination-Participation Restrictions Yard Work    Stability/Clinical Decision Making Evolving/Moderate complexity    Rehab Potential Good    PT Frequency 2x / week    PT Duration 4 weeks    PT Treatment/Interventions ADLs/Self Care Home Management;Cryotherapy;Functional mobility training;Therapeutic activities;Therapeutic exercise;Neuromuscular re-education;Patient/family education;Manual techniques;Passive range of motion;Dry needling;Taping;Moist Heat;Electrical Stimulation    PT Next Visit Plan Progress Overhead mobility, stabilization of RTC in overhead motions    PT Home Exercise Plan Medbridge access code: [email or web address]    Consulted and Agree with Plan of Care Patient           Patient will benefit from skilled therapeutic intervention in order to improve the following deficits and impairments:  Decreased strength, Decreased range of motion, Pain, Impaired flexibility, Decreased activity tolerance, Improper body mechanics, Impaired UE functional use  Visit Diagnosis: Chronic left shoulder pain  Decreased range of motion of left shoulder  Abnormal posture  Muscle weakness (generalized)     Problem List There are no problems to display for this patient.   Cammie Mcgee, PT, DPT # [address] SPT 06/14/2020, 8:45 AM  [facility] [address] [place], [place], [address] Phone: [phone]   Fax:  [phone]  Name: Virginia Pollard MRN: [id number] Date of Birth: 1946/04/07

## 2020-06-15 ENCOUNTER — Other Ambulatory Visit: Payer: Self-pay

## 2020-06-15 ENCOUNTER — Ambulatory Visit: Payer: Medicare Other

## 2020-06-15 ENCOUNTER — Encounter: Payer: Self-pay | Admitting: Physical Therapy

## 2020-06-15 DIAGNOSIS — M25612 Stiffness of left shoulder, not elsewhere classified: Secondary | ICD-10-CM

## 2020-06-15 DIAGNOSIS — M25512 Pain in left shoulder: Secondary | ICD-10-CM | POA: Diagnosis not present

## 2020-06-15 DIAGNOSIS — M6281 Muscle weakness (generalized): Secondary | ICD-10-CM

## 2020-06-15 DIAGNOSIS — R293 Abnormal posture: Secondary | ICD-10-CM

## 2020-06-15 DIAGNOSIS — G8929 Other chronic pain: Secondary | ICD-10-CM

## 2020-06-15 NOTE — Therapy (Signed)
[facility] [address] [place], [place], [address] Phone: [phone]   Fax:  [phone]  Physical Therapy Treatment  Patient Details  Name: Virginia Pollard MRN: [id number] Date of Birth: 18-Dec-1945 Referring Provider (PT): Lasandra Beech, [place]   Encounter Date: 06/15/2020   PT End of Session - 06/15/20 1835    Visit Number 8    Number of Visits 9    Date for PT Re-Evaluation 06/20/20    Authorization - Visit Number 8    Authorization - Number of Visits 10    PT Start Time 1718    PT Stop Time 1801    PT Time Calculation (min) 43 min    Activity Tolerance Patient tolerated treatment well;No increased pain    Behavior During Therapy WFL for tasks assessed/performed           Past Medical History:  Diagnosis Date  . Hyperlipidemia   . Hypertension     Past Surgical History:  Procedure Laterality Date  . TUBAL LIGATION      There were no vitals filed for this visit.   Subjective Assessment - 06/15/20 1834    Subjective Pt states further improvement from PT. Pt states previosuly PT has helped improve L shoulder elevation but she had "achiness" with it. Now pt reports in the last couple week further improvement in AROM without the ache feeling.    Pertinent History Cortisone injection shot on 05/05/2020. L shoulder pain 6-8 months.    Diagnostic tests X-ray (no MRI at this time)    Patient Stated Goals Return to shooting basketball, yard work, and exercise.    Currently in Pain? No/denies    Multiple Pain Sites No          There.ex:   Seated L shoulder flex with therapist MWM on scapula to promote upward rotation to improve L shoulder flexion: 2x12  Standing D1/D2 with 3 lbs DB: 1x12/pattern. Difficulty with correct form. Use of red TB with improvements in form/technique. B 1x12 with red TB.  Sitting rhythmic stabilization in multiple overhead planes (flexion, scaption): 3x12, min verbal cues for correcting post  lean.   Side lying L shoulder ER with manual resistance (min, 3-4/10 of difficulty): 2x12, towel roll b/t elbow and torso for technique.  Seated lat pulldown: 1x12, 60 lbs, 2x12, 70 lbs   Walking ball walks down hallway: 2x34' overhead. 2x34' with moving ball in below head and overhead positions.    PT Long Term Goals - 05/24/20 1055      PT LONG TERM GOAL #1   Title Pt will improve FOTO score to 66 to display pt improvements in ability to perform ADL's.    Baseline 05/23/2020: initial score of 53;    Time 4    Period Weeks    Status New    Target Date 06/20/20      PT LONG TERM GOAL #2   Title Pt will increase L shoulder flexion/abduction AROM >165 deg to allow overhead recreational activities and ADL's.    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    Baseline 05/23/2020: 5/10 pain via NPS  with L shoulder overhead motions prior to cortisone injection.    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    Baseline 05/23/2020: rounded shoulder/ slight forward head/ tight pecs    Time 4    Period Weeks    Status New    Target Date 06/20/20                 Plan - 06/15/20 1835    Clinical Impression Statement Pt improving in her L shoulder flexion AROM. Post treatment, pt able to elevate L shoulder to 150 deg, and abduct 110 deg. Pt progressing to seated rhythmic stbailization in different planes of motion with ability to maintain perturbations at elbow with min verbal cues to correct post lean. Pt can continue to benefit from skilled PT to further improve overhead motion  for functional mobility.    Personal Factors and Comorbidities Comorbidity 2;Time since onset of injury/illness/exacerbation    Examination-Activity Limitations Carry;Reach Overhead;Lift    Examination-Participation Restrictions Yard Work    Stability/Clinical Decision Making Evolving/Moderate complexity    Rehab Potential Good    PT Frequency 2x / week    PT Duration 4 weeks    PT Treatment/Interventions ADLs/Self Care Home Management;Cryotherapy;Functional mobility training;Therapeutic activities;Therapeutic exercise;Neuromuscular re-education;Patient/family education;Manual techniques;Passive range of motion;Dry needling;Taping;Moist Heat;Electrical Stimulation    PT Next Visit Plan Progress Overhead mobility, stabilization of RTC in overhead motions    PT Home Exercise Plan Medbridge access code: [email or web address]    Consulted and Agree with Plan of Care Patient           Patient will benefit from skilled therapeutic intervention in order to improve the following deficits and impairments:  Decreased strength, Decreased range of motion, Pain, Impaired flexibility, Decreased activity tolerance, Improper body mechanics, Impaired UE functional use  Visit Diagnosis: Chronic left shoulder pain  Decreased range of motion of left shoulder  Abnormal posture  Muscle weakness (generalized)     Problem List There are no problems to display for this patient.   Ronnie Derby, SPT 06/15/2020, 6:38 PM Max Fickle, PT, DPT Physical Therapist - [place]    [facility] [address] [place], [place], [address] Phone: [phone]   Fax:  [phone]  Name: Virginia Pollard MRN: [id number] Date of Birth: 1946-03-01

## 2020-06-20 ENCOUNTER — Other Ambulatory Visit: Payer: Self-pay

## 2020-06-20 ENCOUNTER — Ambulatory Visit: Payer: Medicare Other | Attending: Orthopedic Surgery | Admitting: Physical Therapy

## 2020-06-20 ENCOUNTER — Encounter: Payer: Self-pay | Admitting: Physical Therapy

## 2020-06-20 DIAGNOSIS — R293 Abnormal posture: Secondary | ICD-10-CM

## 2020-06-20 DIAGNOSIS — G8929 Other chronic pain: Secondary | ICD-10-CM | POA: Diagnosis present

## 2020-06-20 DIAGNOSIS — M6281 Muscle weakness (generalized): Secondary | ICD-10-CM | POA: Insufficient documentation

## 2020-06-20 DIAGNOSIS — M25612 Stiffness of left shoulder, not elsewhere classified: Secondary | ICD-10-CM | POA: Insufficient documentation

## 2020-06-20 DIAGNOSIS — M25512 Pain in left shoulder: Secondary | ICD-10-CM | POA: Insufficient documentation

## 2020-06-20 NOTE — Therapy (Signed)
[place] [facility] [address] [place], [place], [address] Phone: [phone]   Fax:  [phone]  Physical Therapy Treatment  Patient Details  Name: Virginia Pollard MRN: [id number] Date of Birth: October 15, 1946 Referring Provider (PT): Lasandra Beech, [place]   Encounter Date: 06/20/2020   PT End of Session - 06/20/20 1756    Visit Number 9    Number of Visits 13    Date for PT Re-Evaluation 07/04/20    Authorization - Visit Number 1    Authorization - Number of Visits 10    PT Start Time 1712    PT Stop Time 1759    PT Time Calculation (min) 47 min    Activity Tolerance Patient tolerated treatment well;No increased pain    Behavior During Therapy WFL for tasks assessed/performed           Past Medical History:  Diagnosis Date  . Hyperlipidemia   . Hypertension     Past Surgical History:  Procedure Laterality Date  . TUBAL LIGATION      There were no vitals filed for this visit.   Subjective Assessment - 06/20/20 1754    Subjective Pt continues to have no pain in L shoulder. Pt reports improved muscular endurance with overhead activities like fixing her hair in the evenings.    Pertinent History Cortisone injection shot on 05/05/2020. L shoulder pain 6-8 months.    Diagnostic tests X-ray (no MRI at this time)    Patient Stated Goals Return to shooting basketball, yard work, and exercise.    Currently in Pain? No/denies    Multiple Pain Sites No              Reassessed goals for RECERT. Pt accomplished all goals except L shoulder AROM flex/abd goal. Making improvements but has not achieved 165 deg flex/abd.   L shoulder flex: 155 deg   L shoulder abd: 115 deg  There.ex:  AAROM wall ladder for L shoulder flex: 3x5 reps  Lat pulldown: 1x20, 60 lbs 2x12, 70 lbs  L shoulder ER walkouts with R TB: 2x8, 2 steps out. Mod tactile cues for form.   Wall push-ups: 2x8  Overhead ball passes/chest pass to therapist: 2x20,  passing ball multiples distances in different planes.  Walking ball crawl in hallway (34'): x2 overhead, x2 moving ball up/down in different ranges of Shoulder elevation. No rest in between.   Standing rhythmic stabilization in different levels of L shoulder flexion, scaption, abd: 3x1 min at elbow    PT Long Term Goals - 06/20/20 1805      PT LONG TERM GOAL #1   Title Pt will improve FOTO score to 66 to display pt improvements in ability to perform ADL's.    Baseline 05/23/2020: initial score of 53; 8/3: 80    Time 0    Period Weeks    Status Achieved    Target Date 06/20/20      PT LONG TERM GOAL #2   Title Pt will increase L shoulder flexion/abduction AROM >165 deg to allow overhead recreational activities and ADL's.    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    Baseline 05/23/2020: 5/10 pain via NPS with L shoulder overhead motions prior to cortisone injection.; 8/3: no pain with all overhead motions at work.    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    Baseline 05/23/2020: rounded shoulder/ slight forward head/ tight pecs; 8/3: improved upright posture with scap retraction and less forward head posture.    Time 0    Period Weeks    Status Achieved    Target Date 06/20/20      PT LONG TERM GOAL #5   Title Pt. will increase L shoulder strength to grossly 4+/5 MMT as compared to R UE to improve overhead tasks/ reaching.    Baseline see flowsheet    Time 2    Period Weeks    Status New    Target Date 07/04/20               Plan - 06/20/20 1802    Clinical Impression Statement Pt accomplished 3/4 goals. Pt scored a 80 of FOTO displaying functional mobility improvements with ADL's. Pt improving in her L shoulder AROM w/ L shoulder flex at 165 deg and L shoulder abd at 115 deg with a goal of achieving 165 deg of Flex and abd. Making progress but has not achieved AROM goal. Pt reports no pain overhead tasks at work with a goal of less than a 2/1o NPS displaying improved ability to perform job tasks. Pt reports good, upright posture while at work also. Pt can continue to benefit from therapy to further improved overhead motion and stability to prevent future injury.    Stability/Clinical Decision Making Evolving/Moderate complexity    Clinical Decision Making Moderate    Rehab Potential Good    PT Frequency 2x / week    PT Duration 2 weeks    PT Treatment/Interventions ADLs/Self Care Home Management;Cryotherapy;Functional mobility training;Therapeutic activities;Therapeutic exercise;Neuromuscular re-education;Patient/family education;Manual techniques;Passive range of motion;Dry needling;Taping;Moist Heat;Electrical Stimulation    PT Next Visit Plan L shoulder overhead motion    PT Home Exercise Plan Medbridge access code: [email or web address]           Patient will benefit from skilled therapeutic intervention in order to improve the following deficits and impairments:  Decreased strength, Decreased range of motion, Pain, Impaired flexibility, Decreased activity tolerance, Improper body mechanics, Impaired UE functional use  Visit Diagnosis: Chronic left shoulder pain  Decreased range of motion of left shoulder  Abnormal posture  Muscle weakness (generalized)     Problem List There are no problems to display for this patient.  Cammie Mcgee, PT, DPT # [id number] Ronnie Derby, SPT 06/21/2020, 3:14 PM  [place] [address] [place], [place],  [address] Phone: [phone]   Fax:  [phone]  Name: Ladonne Sharples MRN: [id number] Date of Birth: [birth year]

## 2020-06-22 ENCOUNTER — Encounter: Payer: Self-pay | Admitting: Physical Therapy

## 2020-06-22 ENCOUNTER — Ambulatory Visit: Payer: Medicare Other | Admitting: Physical Therapy

## 2020-06-22 ENCOUNTER — Other Ambulatory Visit: Payer: Self-pay

## 2020-06-22 DIAGNOSIS — M25612 Stiffness of left shoulder, not elsewhere classified: Secondary | ICD-10-CM

## 2020-06-22 DIAGNOSIS — R293 Abnormal posture: Secondary | ICD-10-CM

## 2020-06-22 DIAGNOSIS — G8929 Other chronic pain: Secondary | ICD-10-CM

## 2020-06-22 DIAGNOSIS — M6281 Muscle weakness (generalized): Secondary | ICD-10-CM

## 2020-06-22 DIAGNOSIS — M25512 Pain in left shoulder: Secondary | ICD-10-CM | POA: Diagnosis not present

## 2020-06-22 NOTE — Therapy (Signed)
[facility] [address] [place], [place], [address] Phone: [phone]   Fax:  [phone]  Physical Therapy Treatment  Patient Details  Name: Makeyla Govan MRN: [id number] Date of Birth: 21-Sep-1946 Referring Provider (PT): Lasandra Beech, [place]   Encounter Date: 06/22/2020   PT End of Session - 06/22/20 1804    Visit Number 10    Number of Visits 13    Date for PT Re-Evaluation 07/04/20    Authorization - Visit Number 2    Authorization - Number of Visits 10    PT Start Time 1715    PT Stop Time 1800    PT Time Calculation (min) 45 min    Activity Tolerance Patient tolerated treatment well;No increased pain    Behavior During Therapy WFL for tasks assessed/performed           Past Medical History:  Diagnosis Date  . Hyperlipidemia   . Hypertension     Past Surgical History:  Procedure Laterality Date  . TUBAL LIGATION      There were no vitals filed for this visit.   Subjective Assessment - 06/22/20 1803    Subjective Pt reports no pain in L shoulder. Able to step up onto stool and change air filters with no pain and good L shoulder flexion to perform.    Pertinent History Cortisone injection shot on 05/05/2020. L shoulder pain 6-8 months.    Diagnostic tests X-ray (no MRI at this time)    Patient Stated Goals Return to shooting basketball, yard work, and exercise.    Currently in Pain? No/denies    Multiple Pain Sites No          there.ex:  L shoulder flexion climbs: 1x10  D1/D1 L shoulder rhythmic stab: Red Tb, 2x12 each pattern B scaption 3 lbs DB raise: 1x12  Standing rhythmic stabilization: 1x 1.5 min Lat pulldown at Nautilus:   1x15, 70 lbs   1x12, 80 lbs   1x8, 90 lbs Wall walks with green exercise ball with BUE's overhead: x4 (34') Standing N[place]ilus scap retraction:   1x12, 40 lbs   1x12, 50 lbs  L shoulder AROM:   Flex: 160 deg   Abd: 122 deg   PT Long Term Goals - 06/20/20 1805      PT  LONG TERM GOAL #1   Title Pt will improve FOTO score to 66 to display pt improvements in ability to perform ADL's.    Baseline 05/23/2020: initial score of 53; 8/3: 80    Time 0    Period Weeks    Status Achieved    Target Date 06/20/20      PT LONG TERM GOAL #2   Title Pt will increase L shoulder flexion/abduction AROM >165 deg to allow overhead recreational activities and ADL's.    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    Baseline 05/23/2020: 5/10 pain via NPS with L shoulder overhead motions prior to cortisone injection.; 8/3: no pain with all overhead motions at work.    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    Baseline 05/23/2020: rounded shoulder/ slight forward head/ tight pecs; 8/3: improved upright posture with scap retraction and less forward head posture.    Time 0    Period Weeks    Status Achieved    Target Date 06/20/20      PT LONG TERM GOAL #5   Title Pt. will increase L shoulder strength to grossly 4+/5 MMT as compared to R UE to improve overhead tasks/ reaching.    Baseline see flowsheet    Time 2    Period Weeks    Status New    Target Date 07/04/20                 Plan - 06/22/20 1805    Clinical Impression Statement Pt performed L shoulder flexion AROM to 160 deg with no pain. Pt progressing with dynamic shoulder stabilization in overhead motions with no pain and minimal verbal cues for form. Pt progressing in periscapular strength.    Stability/Clinical Decision Making  Evolving/Moderate complexity    Rehab Potential Good    PT Frequency 2x / week    PT Duration 2 weeks    PT Treatment/Interventions ADLs/Self Care Home Management;Cryotherapy;Functional mobility training;Therapeutic activities;Therapeutic exercise;Neuromuscular re-education;Patient/family education;Manual techniques;Passive range of motion;Dry needling;Taping;Moist Heat;Electrical Stimulation    PT Next Visit Plan L shoulder overhead motion stabilization    PT Home Exercise Plan Medbridge access code: [email or web address]           Patient will benefit from skilled therapeutic intervention in order to improve the following deficits and impairments:  Decreased strength, Decreased range of motion, Pain, Impaired flexibility, Decreased activity tolerance, Improper body mechanics, Impaired UE functional use  Visit Diagnosis: Chronic left shoulder pain  Decreased range of motion of left shoulder  Abnormal posture  Muscle weakness (generalized)     Problem List There are no problems to display for this patient.  Cammie Mcgee, PT, DPT # [id number], SPT 06/23/2020, 1:45 PM  [facility] [address] [place], [place], [address] Phone: [phone]   Fax:  [phone]  Name: Germaine Ripp MRN: [id number] Date of Birth: 1946-05-24

## 2020-06-27 ENCOUNTER — Ambulatory Visit: Payer: Medicare Other | Admitting: Physical Therapy

## 2020-06-27 ENCOUNTER — Other Ambulatory Visit: Payer: Self-pay

## 2020-06-27 ENCOUNTER — Encounter: Payer: Self-pay | Admitting: Physical Therapy

## 2020-06-27 DIAGNOSIS — M25612 Stiffness of left shoulder, not elsewhere classified: Secondary | ICD-10-CM

## 2020-06-27 DIAGNOSIS — M6281 Muscle weakness (generalized): Secondary | ICD-10-CM

## 2020-06-27 DIAGNOSIS — M25512 Pain in left shoulder: Secondary | ICD-10-CM | POA: Diagnosis not present

## 2020-06-27 DIAGNOSIS — R293 Abnormal posture: Secondary | ICD-10-CM

## 2020-06-27 NOTE — Therapy (Signed)
[place] [facility] [address] [place], [place], [address] Phone: [phone]   Fax:  [phone]  Physical Therapy Treatment  Patient Details  Name: Virginia Pollard MRN: [id number] Date of Birth: 05-01-1946 Referring Provider (PT): Lasandra Beech, [place]   Encounter Date: 06/27/2020   PT End of Session - 06/27/20 1804    Visit Number 11    Number of Visits 13    Date for PT Re-Evaluation 07/04/20    Authorization - Visit Number 3    Authorization - Number of Visits 10    PT Start Time 1710    PT Stop Time 1802    PT Time Calculation (min) 52 min    Activity Tolerance Patient tolerated treatment well;No increased pain    Behavior During Therapy WFL for tasks assessed/performed           Past Medical History:  Diagnosis Date  . Hyperlipidemia   . Hypertension     Past Surgical History:  Procedure Laterality Date  . TUBAL LIGATION      There were no vitals filed for this visit.   Subjective Assessment - 06/27/20 1803    Subjective Pt continues to have no pain in L shoulder. Pt reports being able to hang outdoor wreath over head with no issues and no pain.    Pertinent History Cortisone injection shot on 05/05/2020. L shoulder pain 6-8 months.    Diagnostic tests X-ray (no MRI at this time)    Patient Stated Goals Return to shooting basketball, yard work, and exercise.    Currently in Pain? No/denies    Multiple Pain Sites No         There.ex:  AAROM L shoulder flexion wall ladder: 1x12   Standing D1/D1 L shoulder patterns with Red TB: 2x12. Min verbal/tactile cues for form/technique.   Seated Lat pulldowns on N[place]ilus:   1x15, 60 lbs   1x15, 70 lbs   1x12, 80 lbs   2x10, 95 lbs  Standing scap retractions at [place]: 2x20, 40 lbs. Improved form/technique with no thoracic extension.   Overhead exercise ball walks with green exercise ball: 34'x6. 34'x4 moving ball overhead and down low toward floor.   Body blade L  shoulder: by her side 1 min x2, ER/IR 1 minx2, BUE flexion, 2x1 min. Mod verbal and tactile cues for form.   Standing B shoulder flexion tapping sticky notes on walls of different heights and planes with yellow TB around elbows: 1 min x3.    PT Long Term Goals - 06/20/20 1805      PT LONG TERM GOAL #1   Title Pt will improve FOTO score to 66 to display pt improvements in ability to perform ADL's.    Baseline 05/23/2020: initial score of 53; 8/3: 80    Time 0    Period Weeks    Status Achieved    Target Date 06/20/20      PT LONG TERM GOAL #2   Title Pt will increase L shoulder flexion/abduction AROM >165 deg to allow overhead recreational activities and ADL's.    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    Baseline 05/23/2020: 5/10 pain via NPS with L shoulder overhead motions prior to cortisone injection.; 8/3: no pain with all overhead motions at work.    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    Baseline 05/23/2020: rounded shoulder/ slight forward head/ tight pecs; 8/3: improved upright posture with scap retraction and less forward head posture.    Time 0    Period Weeks    Status Achieved    Target Date 06/20/20      PT LONG TERM GOAL #5   Title Pt. will increase L shoulder strength to grossly 4+/5 MMT as compared to R UE to improve overhead tasks/ reaching.    Baseline see flowsheet    Time 2    Period Weeks    Status New    Target Date 07/04/20                 Plan - 06/27/20 1804    Clinical  Impression Statement Pt progressing in overhead L shoulder stability improving endurance with wall walks with larger exercise ball to x6 compared to x4 from previous sessions. Pt required mod verbal and tactile cues with single UE body blade exercises. Pt maintaining 160 deg L shoulder flexion AROM with no pain and 110 deg L shoulder abduction. Pt can continue to benefit from skilled PT to improve overhead shoulder stability muscular endurance for overhead ADL's.    Stability/Clinical Decision Making Evolving/Moderate complexity    Rehab Potential Good    PT Frequency 2x / week    PT Duration 2 weeks    PT Treatment/Interventions ADLs/Self Care Home Management;Cryotherapy;Functional mobility training;Therapeutic activities;Therapeutic exercise;Neuromuscular re-education;Patient/family education;Manual techniques;Passive range of motion;Dry needling;Taping;Moist Heat;Electrical Stimulation    PT Next Visit Plan L shoulder overhead motion stabilization    PT Home Exercise Plan Medbridge access code: [email or web address]           Patient will benefit from skilled therapeutic intervention in order to improve the following deficits and impairments:  Decreased strength, Decreased range of motion, Pain, Impaired flexibility, Decreased activity tolerance, Improper body mechanics, Impaired UE functional use  Visit Diagnosis: Chronic left shoulder pain  Decreased range of motion of left shoulder  Abnormal posture  Muscle weakness (generalized)     Problem List There are no problems to display for this patient.  Cammie Mcgee, PT, DPT # [id number] SPT 06/28/2020, 6:34 PM  [facility] [address] [place], [place], [address] Phone: [phone]   Fax:  [phone]  Name: Virginia Pollard MRN: [id number] Date of Birth: 02-10-1946

## 2020-06-29 ENCOUNTER — Encounter: Payer: Self-pay | Admitting: Physical Therapy

## 2020-06-29 ENCOUNTER — Other Ambulatory Visit: Payer: Self-pay

## 2020-06-29 ENCOUNTER — Ambulatory Visit: Payer: Medicare Other | Admitting: Physical Therapy

## 2020-06-29 DIAGNOSIS — R293 Abnormal posture: Secondary | ICD-10-CM

## 2020-06-29 DIAGNOSIS — M25512 Pain in left shoulder: Secondary | ICD-10-CM | POA: Diagnosis not present

## 2020-06-29 DIAGNOSIS — G8929 Other chronic pain: Secondary | ICD-10-CM

## 2020-06-29 DIAGNOSIS — M6281 Muscle weakness (generalized): Secondary | ICD-10-CM

## 2020-06-29 DIAGNOSIS — M25612 Stiffness of left shoulder, not elsewhere classified: Secondary | ICD-10-CM

## 2020-06-29 NOTE — Therapy (Signed)
[facility] [address] [place], [place], [address] Phone: [phone]   Fax:  [phone]  Physical Therapy Treatment  Patient Details  Name: Virginia Pollard MRN: [id number] Date of Birth: 1946/07/18 Referring Provider (PT): Lasandra Beech, [place]   Encounter Date: 06/29/2020   PT End of Session - 06/29/20 1846    Visit Number 12    Number of Visits 13    Date for PT Re-Evaluation 07/04/20    Authorization - Visit Number 4    Authorization - Number of Visits 10    PT Start Time 1715    PT Stop Time 1800    PT Time Calculation (min) 45 min    Activity Tolerance Patient tolerated treatment well;No increased pain    Behavior During Therapy WFL for tasks assessed/performed           Past Medical History:  Diagnosis Date  . Hyperlipidemia   . Hypertension     Past Surgical History:  Procedure Laterality Date  . TUBAL LIGATION      There were no vitals filed for this visit.   Subjective Assessment - 06/29/20 1845    Subjective No pain in R shoulder. 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 In agreement with PT on this upcoming tuesday being discharged from PT.    Pertinent History Cortisone injection shot on 05/05/2020. L shoulder pain 6-8 months.    Diagnostic tests X-ray (no MRI at this time)    Patient Stated Goals Return to shooting basketball, yard work, and exercise.    Currently in Pain? No/denies    Multiple Pain Sites No          There.ex:   Nautilus:   Lat pulldown: 1x20, 70 lbs. 2x15, 95 lbs  B shoulder adduction: 30 lbs, 2x10. Mod tactile cues for form   B shoulder extension: 30 lbs, 2x12   Standing scap retraction: 2x12, 40 lbs   Chest press med ball pass with med med ball: 1x20 for chest and tricep strength   AAROM L shoulder flexion wall ladder: 1x12   Exercise ball overhead walk outs 34': 2x8   L shoulder AROM   Flexion: 160   Abduction:  110   PT Long Term Goals - 06/20/20 1805      PT LONG TERM GOAL #1   Title Pt will improve FOTO score to 66 to display pt improvements in ability to perform ADL's.    Baseline 05/23/2020: initial score of 53; 8/3: 80    Time 0    Period Weeks    Status Achieved    Target Date 06/20/20      PT LONG TERM GOAL #2   Title Pt will increase L shoulder flexion/abduction AROM >165 deg to allow overhead recreational activities and ADL's.    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    Baseline 05/23/2020: 5/10 pain via NPS with L shoulder overhead motions prior to cortisone injection.; 8/3: no pain with all overhead motions at work.  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    Baseline 05/23/2020: rounded shoulder/ slight forward head/ tight pecs; 8/3: improved upright posture with scap retraction and less forward head posture.    Time 0    Period Weeks    Status Achieved    Target Date 06/20/20      PT LONG TERM GOAL #5   Title Pt. will increase L shoulder strength to grossly 4+/5 MMT as compared to R UE to improve overhead tasks/ reaching.    Baseline see flowsheet    Time 2    Period Weeks    Status New    Target Date 07/04/20                 Plan - 06/29/20 1847    Clinical Impression Statement Pt progressing in periscapular shoulder strength and continues to have no pain. Pt requires intermittent mod verbal and tactile cues for form/technique. Pt continues to be able to elevate L shoulder to 160 deg and abd L  shoulder to 110 deg. Pt ready for discharge next visit.    Stability/Clinical Decision Making Evolving/Moderate complexity    Rehab Potential Good    PT Frequency 2x / week    PT Duration 2 weeks    PT Treatment/Interventions ADLs/Self Care Home Management;Cryotherapy;Functional mobility training;Therapeutic activities;Therapeutic exercise;Neuromuscular re-education;Patient/family education;Manual techniques;Passive range of motion;Dry needling;Taping;Moist Heat;Electrical Stimulation    PT Next Visit Plan L shoulder overhead motion stabilization    PT Home Exercise Plan Medbridge access code: [email or web address]           Patient will benefit from skilled therapeutic intervention in order to improve the following deficits and impairments:  Decreased strength, Decreased range of motion, Pain, Impaired flexibility, Decreased activity tolerance, Improper body mechanics, Impaired UE functional use  Visit Diagnosis: Chronic left shoulder pain  Decreased range of motion of left shoulder  Abnormal posture  Muscle weakness (generalized)     Problem List There are no problems to display for this patient.  Cammie Mcgee, PT, DPT # [address] SPT 06/29/2020, 8:44 PM  [facility] [address] [place], [place], [address] Phone: [phone]   Fax:  [phone]  Name: Calisa Luckenbaugh MRN: [id number] Date of Birth: [birth year]

## 2020-07-04 ENCOUNTER — Ambulatory Visit: Payer: Medicare Other | Admitting: Physical Therapy

## 2020-07-04 ENCOUNTER — Encounter: Payer: Self-pay | Admitting: Physical Therapy

## 2020-07-04 ENCOUNTER — Other Ambulatory Visit: Payer: Self-pay

## 2020-07-04 DIAGNOSIS — M25612 Stiffness of left shoulder, not elsewhere classified: Secondary | ICD-10-CM

## 2020-07-04 DIAGNOSIS — M25512 Pain in left shoulder: Secondary | ICD-10-CM | POA: Diagnosis not present

## 2020-07-04 DIAGNOSIS — G8929 Other chronic pain: Secondary | ICD-10-CM

## 2020-07-04 DIAGNOSIS — R293 Abnormal posture: Secondary | ICD-10-CM

## 2020-07-04 DIAGNOSIS — M6281 Muscle weakness (generalized): Secondary | ICD-10-CM

## 2020-07-04 NOTE — Therapy (Signed)
[facility] [address] [place], [place], [address] Phone: [phone]   Fax:  [phone]  Physical Therapy Treatment/Discharge Summary  Patient Details  Name: Virginia Pollard MRN: [id number] Date of Birth: November 29, 1945 Referring Provider (PT): Tamala Julian, [place]   Encounter Date: 07/04/2020   PT End of Session - 07/04/20 1754    Visit Number 13    Number of Visits 13    Date for PT Re-Evaluation 07/04/20    Authorization - Visit Number 5    Authorization - Number of Visits 10    PT Start Time 6962    PT Stop Time 9528    PT Time Calculation (min) 57 min    Activity Tolerance Patient tolerated treatment well;No increased pain    Behavior During Therapy WFL for tasks assessed/performed           Past Medical History:  Diagnosis Date  . Hyperlipidemia   . Hypertension     Past Surgical History:  Procedure Laterality Date  . TUBAL LIGATION      There were no vitals filed for this visit.   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 Pt ready for D/C from PT.    Pertinent History Cortisone injection shot on 05/05/2020. L shoulder pain 6-8 months.    Diagnostic tests X-ray (no MRI at this time)    Patient Stated Goals Return to shooting basketball, yard work, and exercise.    Currently in Pain? No/denies    Multiple Pain Sites No          Reassessment of goals:   BUE MMT strength. 5/5 Bilaterally except for B shoulder flex at 4+/5   L shoulder flexion to 165 deg  AAROM wall ladder L shoulder: 1x6  L shoulder flexion with therapist MWM on scapula to promote upward rotation to improve flexion AROM. 2x8  Form/technique with progressive HEP. Performed 1x10 for all exercises below:   Shoulder extension blue TB   Scap retraction black TB   B shoulder ER with Red TB   D1/D2 shoulder patterns with Red TB    Wall push-ups   Bicep curls blue TB   Supine serratus punch with blue TB   Overhead ball rotations    PT Education - 07/04/20 1753    Education Details Progressive HEP program for home. Form/technique with exercise.    Person(s) Educated Patient    Methods Explanation;Demonstration;Tactile cues;Verbal cues;Handout    Comprehension Verbalized understanding;Returned demonstration               PT Long Term Goals - 07/04/20 1757      PT LONG TERM GOAL #1   Title Pt will improve FOTO score to 66 to display pt improvements in ability to perform ADL's.    Baseline 05/23/2020: initial score of 53; 8/3: 80    Time 0    Period Weeks    Status Achieved      PT LONG TERM GOAL #2   Title Pt will increase L shoulder flexion/abduction AROM >165 deg to allow overhead recreational activities and ADL's.    Baseline 05/23/2020: L shoulder flex: 150 deg, L shoulder abd: 146 deg with pain 5/10 NPS prior to cortisone injection; 8/3: L shoulder flex: 155 deg, abd 115 deg; 8/17: 165 L shoulder flexion. Still limited to 115 deg for abd on L shoulder.  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    Baseline 05/23/2020: 5/10 pain via NPS with L shoulder overhead motions prior to cortisone injection.; 8/3: no pain with all overhead motions at work.    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    Baseline 05/23/2020: rounded shoulder/ slight forward head/ tight pecs; 8/3: improved upright posture with scap retraction and less forward head posture.    Time 0    Period Weeks    Status Achieved      PT LONG TERM GOAL #5   Title Pt. will increase L shoulder strength to grossly 4+/5 MMT as compared to R UE to improve  overhead tasks/ reaching.    Baseline see flowsheet; 8/17: 4+/5 for B shoulder flexion but 5/5 MMT for abd, shrug, biceps/triceps    Time 0    Period Weeks    Status Achieved    Target Date 07/04/20                 Plan - 07/04/20 1754    Clinical Impression Statement Pt achieved last 2 goals of PT and is ready for D/c. Pt able to perform L shoulder flexion to 165 deg with no pain. Pt also displayed 5/5 MMT for BUE's grossly. Pt educated on form/technique for progressive mobility and resistance HEP to prevent future injury. All questions answered and pt demonstrates independence with HEP.    Stability/Clinical Decision Making Evolving/Moderate complexity    Clinical Decision Making Moderate    Rehab Potential Good    PT Frequency 2x / week    PT Duration 2 weeks    PT Treatment/Interventions ADLs/Self Care Home Management;Cryotherapy;Functional mobility training;Therapeutic activities;Therapeutic exercise;Neuromuscular re-education;Patient/family education;Manual techniques;Passive range of motion;Dry needling;Taping;Moist Heat;Electrical Stimulation    PT Next Visit Plan Discharge visit    PT Home Exercise Plan Medbridge access code: [email or web address]    Consulted and Agree with Plan of Care Patient           Patient will benefit from skilled therapeutic intervention in order to improve the following deficits and impairments:  Decreased strength, Decreased range of motion, Pain, Impaired flexibility, Decreased activity tolerance, Improper body mechanics, Impaired UE functional use  Visit Diagnosis: Chronic left shoulder pain  Decreased range of motion of left shoulder  Abnormal posture  Muscle weakness (generalized)     Problem List There are no problems to display for this patient.  Pura Spice, PT, DPT # [id number], SPT 07/05/2020, 9:42 AM   [facility] [address] [place], [place], [address] Phone:  [phone]   Fax:  [phone]  Name: Ladell Bey MRN: [id number] Date of Birth: [birth year]

## 2020-07-05 NOTE — Patient Instructions (Signed)
Access Code: [email or web address]: [email or web address]: 08/18/2021Prepared by: Casimiro Needle SherkExercises  Standing Staggered Push Up with Scapular Retraction at Wall - 1 x daily - 7 x weekly - 3 sets - 10 reps  Shoulder PNF D2 with Resistance - 1 x daily - 7 x weekly - 3 sets - 15 reps  Standing Single Arm Shoulder PNF D1 Extension with Anchored Resistance - 1 x daily - 7 x weekly - 3 sets - 15 reps  Standing Bicep Curls with Resistance - 1 x daily - 7 x weekly - 3 sets - 10 reps  Scapular Retraction with Resistance - 1 x daily - 7 x weekly - 3 sets - 12 reps  Standing Wall [employer] with Mini Swiss Ball - 1 x daily - 7 x weekly - 3 sets - 20 reps  Shoulder extension with resistance - Neutral - 1 x daily - 7 x weekly - 3 sets - 10 reps  Supine Scapular Protraction in Flexion with Dumbbells - 1 x daily - 7 x weekly - 2 sets - 10 reps  Standing Shoulder External Rotation with Resistance - 1 x daily - 7 x weekly - 2 sets - 15 reps

## 2020-08-29 ENCOUNTER — Other Ambulatory Visit: Payer: Self-pay

## 2020-08-29 ENCOUNTER — Ambulatory Visit (INDEPENDENT_AMBULATORY_CARE_PROVIDER_SITE_OTHER): Payer: Medicare Other

## 2020-08-29 ENCOUNTER — Ambulatory Visit
Admission: EM | Admit: 2020-08-29 | Discharge: 2020-08-29 | Disposition: A | Discharge status: Home / Self Care | Payer: Medicare Other | Attending: Family Medicine | Admitting: Family Medicine

## 2020-08-29 ENCOUNTER — Encounter: Payer: Self-pay | Admitting: Emergency Medicine

## 2020-08-29 DIAGNOSIS — S4992XA Unspecified injury of left shoulder and upper arm, initial encounter: Secondary | ICD-10-CM

## 2020-08-29 DIAGNOSIS — M25512 Pain in left shoulder: Secondary | ICD-10-CM | POA: Diagnosis not present

## 2020-08-29 DIAGNOSIS — W19XXXA Unspecified fall, initial encounter: Secondary | ICD-10-CM

## 2020-08-29 DIAGNOSIS — Y92009 Unspecified place in unspecified non-institutional (private) residence as the place of occurrence of the external cause: Secondary | ICD-10-CM | POA: Diagnosis not present

## 2020-08-29 DIAGNOSIS — G8911 Acute pain due to trauma: Secondary | ICD-10-CM

## 2020-08-29 MED ORDER — METAXALONE 800 MG PO TABS: 800.0000 mg | ORAL_TABLET | Freq: Three times a day (TID) | ORAL | 1 refills | Status: AC

## 2020-08-29 NOTE — Discharge Instructions (Addendum)
Imaging negative for fractures.  Wear the sling as long as the pain is really bad.  Once the pain starts to improve hopefully over the next couple of days you should take out of the sling and start to move the shoulder.  Take the muscle relaxer as needed.  Also take Aleve OTC 1-2 times daily. You can also do Tylenol for pain relief.  Consider lidocaine patches as well.  As explained to you may have reinjured your rotator cuff and you may need to go back to physical therapy if you are not improving on your own over the next couple of weeks.

## 2020-08-29 NOTE — ED Provider Notes (Signed)
And says [facility]    CSN: [id number] Arrival date & time: 08/29/20  1942      History   Chief Complaint Chief Complaint  Patient presents with  . Fall  . Shoulder Pain    left    HPI Virginia Pollard is a 74 y.o. female   presenting for left shoulder pain following a fall onto the left shoulder while she was taking out her trash just previous to arrival to [facility].  She says that she fell and hit the shoulder directly on concrete.  She says pain is moderate and she cannot lift it without severe pain.  Patient denies head injury or loss of consciousness.  She denies any other injuries.  She did not denies any inciting symptoms or events including severe headache, dizziness, confusion, weakness, chest pain, palpitations, numbness or tingling.  Patient states that she just got a physical therapy 2 months ago for rotator cuff injury to the left shoulder.  Denies ever having surgery, but did have to have a couple of corticosteroid injections.  Patient has taken Skelaxin recently since she had a previous prescription for this to her other shoulder injury.  She says that she thinks has helped a little bit.  She has not taken anything else for pain relief.  Patient says it hurts to lift the shoulder at all.  She denies any numbness or tingling.  No other complaints or concerns today.  HPI  Past Medical History:  Diagnosis Date  . Hyperlipidemia   . Hypertension     There are no problems to display for this patient.   Past Surgical History:  Procedure Laterality Date  . TUBAL LIGATION      OB History   No obstetric history on file.      Home Medications    Prior to Admission medications   Medication Sig Start Date End Date Taking? Authorizing Provider  lisinopril (PRINIVIL,ZESTRIL) 10 MG tablet Take 10 mg by mouth daily.   Yes [provider]  metFORMIN (GLUCOPHAGE) 500 MG tablet Take by mouth 2 (two) times daily with a meal.   Yes [provider]  simvastatin (ZOCOR) 10 MG tablet Take 10 mg by mouth daily.   Yes [provider]  metaxalone (SKELAXIN) 800 MG tablet Take 1 tablet (800 mg total) by mouth 3 (three) times daily for 10 days. 08/29/20 09/08/20  Shirlee Latch, PA-C    Family History Family History  Problem Relation Age of Onset  . Hypertension Mother     Social History Social History   Tobacco Use  . Smoking status: Never Smoker  . Smokeless tobacco: Never Used  Substance Use Topics  . Alcohol use: No  . Drug use: No     Allergies   Patient has no known allergies.   Review of Systems Review of Systems  Constitutional: Negative for fatigue.  Respiratory: Negative for shortness of breath.   Cardiovascular: Negative for chest pain and palpitations.  Musculoskeletal: Positive for arthralgias. Negative for back pain, joint swelling and neck pain.  Neurological: Negative for dizziness, syncope, weakness, light-headedness, numbness and headaches.  Psychiatric/Behavioral: Negative for confusion.     Physical Exam Triage Vital Signs ED Triage Vitals  Enc Vitals Group     BP 08/29/20 1956 (!) 162/59     Pulse Rate 08/29/20 1956 69     Resp 08/29/20 1956 18     Temp 08/29/20 1956 98 F (36.7 C)  Temp Source 08/29/20 1956 Oral     SpO2 08/29/20 1956 100 %     Weight 08/29/20 1954 149 lb (67.6 kg)     Height 08/29/20 1954 5\' 3"  (1.6 m)     Head Circumference --      Peak Flow --      Pain Score 08/29/20 1954 7     Pain Loc --      Pain Edu? --      Excl. in GC? --    No data found.  Updated Vital Signs BP 140/70 (BP Location: Right Arm)   Pulse 69   Temp 98 F (36.7 C) (Oral)   Resp 18   Ht 5\' 3"  (1.6 m)   Wt 149 lb (67.6 kg)   SpO2 100%   BMI 26.39 kg/m       Physical Exam Vitals and nursing note reviewed.  Constitutional:      General: She is not in acute distress.    Appearance: Normal appearance. She is normal weight. She is not ill-appearing or  toxic-appearing.  HENT:     Head: Normocephalic and atraumatic.     Nose: Nose normal.     Mouth/Throat:     Mouth: Mucous membranes are moist.     Pharynx: Oropharynx is clear.  Eyes:     General: No scleral icterus.       Right eye: No discharge.        Left eye: No discharge.     Conjunctiva/sclera: Conjunctivae normal.     Pupils: Pupils are equal, round, and reactive to light.  Cardiovascular:     Rate and Rhythm: Normal rate and regular rhythm.     Heart sounds: Normal heart sounds.  Pulmonary:     Effort: Pulmonary effort is normal. No respiratory distress.     Breath sounds: Normal breath sounds.  Musculoskeletal:     Right shoulder: Normal strength. Normal pulse.     Left shoulder: Tenderness (biceps groove, deltoid) and bony tenderness (proximal humerus) present. No swelling or deformity. Decreased range of motion (Significantly reduced ROM in all directions especially flexion and abduction, limited to about 30 degrees). Decreased strength (4/5 grip on left, 5/5 on right). Normal pulse.     Cervical back: Neck supple.  Skin:    General: Skin is dry.  Neurological:     General: No focal deficit present.     Mental Status: She is alert and oriented to person, place, and time. Mental status is at baseline.     Cranial Nerves: No cranial nerve deficit.     Motor: No weakness.     Gait: Gait normal.  Psychiatric:        Mood and Affect: Mood normal.        Behavior: Behavior normal.        Thought Content: Thought content normal.      UC Treatments / Results  Labs (all labs ordered are listed, but only abnormal results are displayed) Labs Reviewed - No data to display  EKG   Radiology DG Shoulder Left  Result Date: 08/29/2020 CLINICAL DATA:  74 year old female with fall and trauma to the left shoulder. EXAM: LEFT SHOULDER - 2+ VIEW COMPARISON:  None. FINDINGS: There is no acute fracture or dislocation. The bones are osteopenic. Mild degenerative changes. The  soft tissues are unremarkable. IMPRESSION: No acute fracture or dislocation. Electronically Signed   By: 10/29/2020 M.D.   On: 08/29/2020 20:21    Procedures Procedures (  including critical care time)  Medications Ordered in UC Medications - No data to display  Initial Impression / Assessment and Plan / UC Course  I have reviewed the triage vital signs and the nursing notes.  Pertinent labs & imaging results that were available during my care of the patient were reviewed by me and considered in my medical decision making (see chart for details).    Patient patient's exam DDx is included fracture of humerus or other prior shoulder, rotator cuff injury or tear, contusion, shoulder strain or sprain.  Imaging of the shoulder negative for any fractures or dislocation.  Advised patient of my concerns about possible reinjury of her rotator cuff.  Patient placed in sling at this time for comfort.  Advised to take over-the-counter naproxen for pain relief as well as Tylenol as needed.  Refill for Skelaxin since she says that helped her previously.  Patient advised to follow-up with PCP if she is not starting to improve over the next week or so.  For any worsening symptoms before that, I advised her to follow-up with [facility].  Explained to patient that unfortunately, she may need to go back to physical therapy due to this reinjury.  Patient understanding and agreeable.  Final Clinical Impressions(s) / UC Diagnoses   Final diagnoses:  Acute pain of left shoulder due to trauma  Fall in home, initial encounter     Discharge Instructions     Imaging negative for fractures.  Wear the sling as long as the pain is really bad.  Once the pain starts to improve hopefully over the next couple of days you should take out of the sling and start to move the shoulder.  Take the muscle relaxer as needed.  Also take Aleve OTC 1-2 times daily. You can also do Tylenol for pain relief.  Consider lidocaine  patches as well.  As explained to you may have reinjured your rotator cuff and you may need to go back to physical therapy if you are not improving on your own over the next couple of weeks.    ED Prescriptions    Medication Sig Dispense Auth. Provider   metaxalone (SKELAXIN) 800 MG tablet Take 1 tablet (800 mg total) by mouth 3 (three) times daily for 10 days. 30 tablet Gareth Morgan     PDMP not reviewed this encounter.   Shirlee Latch, PA-C 08/30/20 1042

## 2020-08-29 NOTE — ED Triage Notes (Signed)
Patient states she fell about 1 hour ago while taking the trash out. She states she fell on her left shoulder. She is c/o left shoulder pain.

## 2020-10-09 ENCOUNTER — Ambulatory Visit: Payer: Medicare Other | Admitting: Physical Therapy

## 2020-10-11 ENCOUNTER — Ambulatory Visit: Payer: Medicare Other | Admitting: Physical Therapy

## 2020-10-17 ENCOUNTER — Encounter: Payer: Medicare Other | Admitting: Physical Therapy

## 2020-10-23 ENCOUNTER — Encounter: Payer: Medicare Other | Admitting: Physical Therapy

## 2020-10-25 ENCOUNTER — Encounter: Payer: Medicare Other | Admitting: Physical Therapy

## 2020-10-30 ENCOUNTER — Encounter: Payer: Medicare Other | Admitting: Physical Therapy

## 2020-11-01 ENCOUNTER — Encounter: Payer: Medicare Other | Admitting: Physical Therapy

## 2020-11-06 ENCOUNTER — Encounter: Payer: Medicare Other | Admitting: Physical Therapy

## 2020-11-08 ENCOUNTER — Encounter: Payer: Medicare Other | Admitting: Physical Therapy

## 2020-11-13 ENCOUNTER — Encounter: Payer: Medicare Other | Admitting: Physical Therapy

## 2020-11-15 ENCOUNTER — Encounter: Payer: Medicare Other | Admitting: Physical Therapy

## 2020-12-20 ENCOUNTER — Other Ambulatory Visit: Payer: Self-pay | Admitting: Orthopedic Surgery

## 2020-12-20 DIAGNOSIS — M75102 Unspecified rotator cuff tear or rupture of left shoulder, not specified as traumatic: Secondary | ICD-10-CM

## 2021-01-10 ENCOUNTER — Other Ambulatory Visit: Payer: Self-pay

## 2021-01-10 ENCOUNTER — Ambulatory Visit
Admission: RE | Admit: 2021-01-10 | Discharge: 2021-01-10 | Disposition: A | Discharge status: Home / Self Care | Payer: Medicare Other | Source: Ambulatory Visit | Attending: Orthopedic Surgery | Admitting: Orthopedic Surgery

## 2021-01-10 DIAGNOSIS — M75102 Unspecified rotator cuff tear or rupture of left shoulder, not specified as traumatic: Secondary | ICD-10-CM | POA: Diagnosis not present

## 2021-06-25 ENCOUNTER — Encounter: Payer: Self-pay | Admitting: Emergency Medicine

## 2021-06-25 ENCOUNTER — Ambulatory Visit (INDEPENDENT_AMBULATORY_CARE_PROVIDER_SITE_OTHER): Payer: Medicare Other

## 2021-06-25 ENCOUNTER — Other Ambulatory Visit: Payer: Self-pay

## 2021-06-25 ENCOUNTER — Ambulatory Visit
Admission: EM | Admit: 2021-06-25 | Discharge: 2021-06-25 | Disposition: A | Discharge status: Home / Self Care | Payer: Medicare Other

## 2021-06-25 DIAGNOSIS — M79631 Pain in right forearm: Secondary | ICD-10-CM | POA: Diagnosis not present

## 2021-06-25 DIAGNOSIS — S63501A Unspecified sprain of right wrist, initial encounter: Secondary | ICD-10-CM

## 2021-06-25 NOTE — ED Triage Notes (Signed)
Pt presents today with c/o of pain to right forearm after a fall that occurred at church yesterday when she missed a step.

## 2021-06-25 NOTE — ED Provider Notes (Signed)
[facility]    CSN: [id number] Arrival date & time: 06/25/21  1803      History   Chief Complaint Chief Complaint  Patient presents with   Fall   Arm Injury    HPI Marylin Lathon is a 75 y.o. female.   HPI  75 year old female here for evaluation of right wrist pain.  Patient reports that she was coming out of church yesterday and missed a step causing her to fall forward onto her hands and knees.  She reports that she broke her fall with both of her hands.  She came in to be evaluated today because she was concerned about the amount of redness she was experiencing in her distal forearm and wrist patient denies any numbness or tingling in her hand or fingers.  She has full range of motion of her wrist and hand.  Past Medical History:  Diagnosis Date   Hyperlipidemia    Hypertension     There are no problems to display for this patient.   Past Surgical History:  Procedure Laterality Date   TUBAL LIGATION      OB History   No obstetric history on file.      Home Medications    Prior to Admission medications   Medication Sig Start Date End Date Taking? Authorizing Provider  diclofenac (VOLTAREN) 75 MG EC tablet Take by mouth. 10/03/20 10/03/21 Yes [provider]  lisinopril (PRINIVIL,ZESTRIL) 10 MG tablet Take 10 mg by mouth daily.    [provider]  metFORMIN (GLUCOPHAGE) 500 MG tablet Take by mouth 2 (two) times daily with a meal.    [provider]  simvastatin (ZOCOR) 10 MG tablet Take 10 mg by mouth daily.    [provider]    Family History Family History  Problem Relation Age of Onset   Hypertension Mother     Social History Social History   Tobacco Use   Smoking status: Never   Smokeless tobacco: Never  Substance Use Topics   Alcohol use: No   Drug use: No     Allergies   Patient has no known allergies.   Review of Systems Review of Systems  Constitutional:  Negative for activity  change, appetite change and fever.  Musculoskeletal:  Positive for arthralgias. Negative for joint swelling and myalgias.  Skin:  Positive for color change. Negative for wound.  Neurological:  Negative for weakness and numbness.  Hematological: Negative.   Psychiatric/Behavioral: Negative.      Physical Exam Triage Vital Signs ED Triage Vitals  Enc Vitals Group     BP 06/25/21 1823 (!) 153/65     Pulse Rate 06/25/21 1823 66     Resp 06/25/21 1823 16     Temp 06/25/21 1823 98.9 F (37.2 C)     Temp Source 06/25/21 1823 Oral     SpO2 06/25/21 1823 97 %     Weight --      Height --      Head Circumference --      Peak Flow --      Pain Score 06/25/21 1821 6     Pain Loc --      Pain Edu? --      Excl. in GC? --    No data found.  Updated Vital Signs BP (!) 153/65 (BP Location: Right Arm)   Pulse 66   Temp 98.9 F (37.2 C) (Oral)   Resp 16   SpO2 97%   Visual Acuity  Right Eye Distance:   Left Eye Distance:   Bilateral Distance:    Right Eye Near:   Left Eye Near:    Bilateral Near:     Physical Exam Vitals and nursing note reviewed.  Constitutional:      General: She is not in acute distress.    Appearance: Normal appearance. She is not ill-appearing.  HENT:     Head: Normocephalic and atraumatic.  Musculoskeletal:        General: No swelling, tenderness, deformity or signs of injury. Normal range of motion.  Skin:    General: Skin is warm and dry.     Capillary Refill: Capillary refill takes less than 2 seconds.     Findings: Erythema present. No bruising.  Neurological:     General: No focal deficit present.     Mental Status: She is alert and oriented to person, place, and time.     Sensory: No sensory deficit.     Motor: No weakness.  Psychiatric:        Mood and Affect: Mood normal.        Behavior: Behavior normal.        Thought Content: Thought content normal.        Judgment: Judgment normal.     UC Treatments / Results  Labs (all labs  ordered are listed, but only abnormal results are displayed) Labs Reviewed - No data to display  EKG   Radiology DG Forearm Right  Result Date: 06/25/2021 CLINICAL DATA:  Pain/swelling EXAM: RIGHT FOREARM - 2 VIEW COMPARISON:  None. FINDINGS: There is no evidence of fracture or other focal bone lesions. Soft tissues are unremarkable. IMPRESSION: Negative right forearm radiographs. Electronically Signed   By: Caprice Renshaw   On: 06/25/2021 18:49    Procedures Procedures (including critical care time)  Medications Ordered in UC Medications - No data to display  Initial Impression / Assessment and Plan / UC Course  I have reviewed the triage vital signs and the nursing notes.  Pertinent labs & imaging results that were available during my care of the patient were reviewed by me and considered in my medical decision making (see chart for details).  Patient is a very pleasant, nontoxic-appearing 79 old female here for evaluation of pain in her distal right forearm after suffering a fall yesterday when coming out of church.  Patient reports that she missed a step which caused her to fall forward and she caught herself on her hands and knees breaking her fall onto a concrete path.  Patient has no abrasions of her skin or open wounds.  Patient has full range of motion and use of her right hand and is manipulating and using the functions on her smart phone without difficulty.  Patient's physical exam reveals a right wrist that is in normal anatomical alignment.  Patient has full sensation in all of her fingers and her grip is 5/5 in her right hand.  Patient has no tenderness with palpation of her metacarpal or carpal bones.  There is no tenderness with compression of the radial and ulnar styloid.  Patient has full range of motion without pain or limitation.  X-ray of right forearm was collected at triage.  X-ray of right forearm independently reviewed and evaluated by me.  These views include the carpal  bones.  There is no evidence of fracture or dislocation.  Awaiting radiology overread. Radiology interpretation of x-rays are negative for fracture or dislocation.  Will discharge patient home with  a diagnosis of right wrist sprain.   Final Clinical Impressions(s) / UC Diagnoses   Final diagnoses:  Wrist sprain, right, initial encounter     Discharge Instructions      Use over-the-counter Tylenol and ibuprofen according to the package instructions as needed for pain and inflammation.  Apply ice or moist heat to your right wrist and forearm for 20 minutes at a time 2-3 times a day as needed for pain and inflammation.  Return for reevaluation, or see your primary care provider, for any new or worsening symptoms.     ED Prescriptions   None    PDMP not reviewed this encounter.   Becky Augusta, NP 06/25/21 1911

## 2021-06-25 NOTE — Discharge Instructions (Addendum)
Use over-the-counter Tylenol and ibuprofen according to the package instructions as needed for pain and inflammation.  Apply ice or moist heat to your right wrist and forearm for 20 minutes at a time 2-3 times a day as needed for pain and inflammation.  Return for reevaluation, or see your primary care provider, for any new or worsening symptoms.

## 2021-08-08 ENCOUNTER — Other Ambulatory Visit: Payer: Self-pay

## 2021-08-08 ENCOUNTER — Ambulatory Visit
Admission: EM | Admit: 2021-08-08 | Discharge: 2021-08-08 | Disposition: A | Discharge status: Home / Self Care | Payer: Medicare Other

## 2021-08-08 DIAGNOSIS — R21 Rash and other nonspecific skin eruption: Secondary | ICD-10-CM | POA: Diagnosis not present

## 2021-08-08 DIAGNOSIS — G8929 Other chronic pain: Secondary | ICD-10-CM

## 2021-08-08 DIAGNOSIS — M25512 Pain in left shoulder: Secondary | ICD-10-CM

## 2021-08-08 DIAGNOSIS — W57XXXA Bitten or stung by nonvenomous insect and other nonvenomous arthropods, initial encounter: Secondary | ICD-10-CM | POA: Diagnosis not present

## 2021-08-08 DIAGNOSIS — M25511 Pain in right shoulder: Secondary | ICD-10-CM | POA: Diagnosis not present

## 2021-08-08 DIAGNOSIS — M75122 Complete rotator cuff tear or rupture of left shoulder, not specified as traumatic: Secondary | ICD-10-CM

## 2021-08-08 HISTORY — DX: Unspecified osteoarthritis, unspecified site: M19.90

## 2021-08-08 MED ORDER — BACLOFEN 10 MG PO TABS: 10.0000 mg | ORAL_TABLET | Freq: Every evening | ORAL | 0 refills | Status: DC | PRN

## 2021-08-08 MED ORDER — TRIAMCINOLONE ACETONIDE 0.5 % EX OINT: 1.0000 "application " | TOPICAL_OINTMENT | Freq: Two times a day (BID) | CUTANEOUS | 0 refills | Status: DC

## 2021-08-08 MED ORDER — PREDNISONE 10 MG PO TABS: ORAL_TABLET | ORAL | 0 refills | Status: DC

## 2021-08-08 NOTE — Discharge Instructions (Signed)
-  I am unsure as to the cause of your rash but it seems like an insect bite or sting.  I have sent a topical corticosteroid for you.  You can take Benadryl for itching.  Try not to scratch.  If not improving in the next few days or if symptoms worsen then he can take the oral corticosteroid.  You can put cool packs on the area to help with swelling. -In regards to your bilateral shoulder pain, it is not only have underlying arthritis and neck complete tear of your left rotator cuff.  I suggest that you follow back up with orthopedics and discussed your options.  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  At this time can take Tylenol and use heat or ice.  I have sent a muscle relaxer for at night.

## 2021-08-08 NOTE — ED Provider Notes (Signed)
[facility]    CSN: [id number] Arrival date & time: 08/08/21  1153      History   Chief Complaint Chief Complaint  Patient presents with   Rash   Shoulder Pain    B    HPI Virginia Pollard is a 75 y.o. female presenting for multiple complaints.  First she states that she has itchy bumps on the posterior thighs that she noticed about 3 to 4 days ago.  She has not applied anything to the area.  She does not remember getting bit or stung by anything but was outside recently.  She believes that she could have gotten stung by some mosquitoes but she is unsure.  They are not painful.  No rash elsewhere.  Additionally she reports bilateral shoulder pain that is chronic.  Patient has been told she has a rotator cuff tear of the left shoulder.  She has been advised by orthopedics that she needs to have a surgery.  She was receiving corticosteroid injections in the shoulder but has been told they no longer want to give her injections because she needs to have surgery.  She says she occasionally takes Tylenol and tries to stretch the shoulder.  She has been to physical therapy in the past and would be interested in going back to physical therapy.  She also reports pain of the right shoulder.  She says the shoulder is not really painful when touched and she really only has pain in both of her shoulders whenever she tries to raise them.  No numbness, weakness or tingling.  No other concerns.  HPI  Past Medical History:  Diagnosis Date   Arthritis    Hyperlipidemia    Hypertension     There are no problems to display for this patient.   Past Surgical History:  Procedure Laterality Date   TUBAL LIGATION      OB History   No obstetric history on file.      Home Medications    Prior to Admission medications   Medication Sig Start Date End Date Taking? Authorizing Provider  baclofen (LIORESAL) 10 MG tablet Take 1 tablet (10 mg total) by mouth at bedtime as needed for  muscle spasms. 08/08/21  Yes Shirlee Latch, PA-C  diclofenac (VOLTAREN) 75 MG EC tablet Take by mouth. 10/03/20 10/03/21 Yes [provider]  lisinopril (PRINIVIL,ZESTRIL) 10 MG tablet Take 10 mg by mouth daily.   Yes [provider]  metFORMIN (GLUCOPHAGE-XR) 500 MG 24 hr tablet Take 500 mg by mouth daily. 07/26/21  Yes [provider]  predniSONE (DELTASONE) 10 MG tablet Take 5 tabs PO on day 1 and decrease 1 tab daily until complete 08/08/21  Yes Eusebio Friendly B, PA-C  simvastatin (ZOCOR) 40 MG tablet Take 40 mg by mouth at bedtime. 07/26/21  Yes [provider]  triamcinolone ointment (KENALOG) 0.5 % Apply 1 application topically 2 (two) times daily. 08/08/21  Yes Shirlee Latch, PA-C    Family History Family History  Problem Relation Age of Onset   Hypertension Mother     Social History Social History   Tobacco Use   Smoking status: Never   Smokeless tobacco: Never  Vaping Use   Vaping Use: Never used  Substance Use Topics   Alcohol use: No   Drug use: No     Allergies   Patient has no known allergies.   Review of Systems Review of Systems  Constitutional:  Negative for fatigue and fever.  Musculoskeletal:  Positive for arthralgias. Negative for back pain, joint swelling and neck pain.  Skin:  Positive for rash.  Neurological:  Negative for weakness and numbness.    Physical Exam Triage Vital Signs ED Triage Vitals  Enc Vitals Group     BP 08/08/21 1306 129/65     Pulse Rate 08/08/21 1306 66     Resp 08/08/21 1306 18     Temp 08/08/21 1306 98 F (36.7 C)     Temp Source 08/08/21 1306 Oral     SpO2 08/08/21 1306 97 %     Weight 08/08/21 1303 146 lb (66.2 kg)     Height 08/08/21 1303 5\' 3"  (1.6 m)     Head Circumference --      Peak Flow --      Pain Score 08/08/21 1303 8     Pain Loc --      Pain Edu? --      Excl. in GC? --    No data found.  Updated Vital Signs BP 129/65 (BP Location: Left Arm)   Pulse 66   Temp  98 F (36.7 C) (Oral)   Resp 18   Ht 5\' 3"  (1.6 m)   Wt 146 lb (66.2 kg)   SpO2 97%   BMI 25.86 kg/m      Physical Exam Vitals and nursing note reviewed.  Constitutional:      General: She is not in acute distress.    Appearance: Normal appearance. She is not ill-appearing or toxic-appearing.  HENT:     Head: Normocephalic and atraumatic.  Eyes:     General: No scleral icterus.       Right eye: No discharge.        Left eye: No discharge.     Conjunctiva/sclera: Conjunctivae normal.  Cardiovascular:     Rate and Rhythm: Normal rate and regular rhythm.     Heart sounds: Normal heart sounds.  Pulmonary:     Effort: Pulmonary effort is normal. No respiratory distress.     Breath sounds: Normal breath sounds.  Musculoskeletal:     Right shoulder: No swelling or bony tenderness. Decreased range of motion (decreased to 100 degrees abduction and flexion). Normal strength. Normal pulse.     Left shoulder: No swelling or bony tenderness. Decreased range of motion (decreased to 30 degrees flexion and abduction). Normal strength. Normal pulse.     Cervical back: Neck supple.  Skin:    General: Skin is dry.     Findings: Rash (numerous large erythematous papules bilateral posterior thighs) present.  Neurological:     General: No focal deficit present.     Mental Status: She is alert. Mental status is at baseline.     Motor: No weakness.     Coordination: Coordination normal.     Gait: Gait normal.  Psychiatric:        Mood and Affect: Mood normal.        Behavior: Behavior normal.        Thought Content: Thought content normal.     UC Treatments / Results  Labs (all labs ordered are listed, but only abnormal results are displayed) Labs Reviewed - No data to display  EKG   Radiology No results found.  Procedures Procedures (including critical care time)  Medications Ordered in UC Medications - No data to display  Initial Impression / Assessment and Plan / UC Course   I have reviewed the triage vital signs and the nursing notes.  Pertinent labs & imaging results that were available during my care of the patient were reviewed by me and considered in my medical decision making (see chart for details).  75 year old female presenting for multiple complaints including erythematous and pruritic rash of posterior thighs.  Presentation today is consistent with some sort of insect bite or sting.  I have sent in topical triamcinolone and provided her with a printout for prednisone in case the topical is not working.  Also advised using cool compresses and taking Benadryl as needed for itching.  Reviewed return to ED precautions.  Patient also reporting for bilateral shoulder pain and especially left shoulder pain with reduced range of motion in both shoulders, especially the left shoulder.  Patient has had issues with the left shoulder for nearly a year.  I was able to review MRI results from her shoulder MRI in February of this year which does note full-thickness tears of infraspinatus and supraspinatus tendons.  I have sent in baclofen for her to take in the evening since she has a lot of stiffness and advised her to continue with stretches and range of motion exercises.  Advised patient to follow back up with orthopedics to discuss her options but based on her MRI result looks like she needs to have surgery versus possible physical therapy and dealing with permanent range of motion issues.  Tylenol for pain relief.  Also advise Voltaren gel.  Final Clinical Impressions(s) / UC Diagnoses   Final diagnoses:  Rash  Insect bite, unspecified site, initial encounter  Complete tear of left rotator cuff, unspecified whether traumatic  Chronic pain of both shoulders     Discharge Instructions      -I am unsure as to the cause of your rash but it seems like an insect bite or sting.  I have sent a topical corticosteroid for you.  You can take Benadryl for itching.  Try not  to scratch.  If not improving in the next few days or if symptoms worsen then he can take the oral corticosteroid.  You can put cool packs on the area to help with swelling. -In regards to your bilateral shoulder pain, it is not only have underlying arthritis and neck complete tear of your left rotator cuff.  I suggest that you follow back up with orthopedics and discussed your options.  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  At this time can take Tylenol and use heat or ice.  I have sent a muscle relaxer for at night.     ED Prescriptions     Medication Sig Dispense Auth. Provider   predniSONE (DELTASONE) 10 MG tablet Take 5 tabs PO on day 1 and decrease 1 tab daily until complete 15 tablet Eusebio Friendly B, PA-C   baclofen (LIORESAL) 10 MG tablet Take 1 tablet (10 mg total) by mouth at bedtime as needed for muscle spasms. 30 each Eusebio Friendly B, PA-C   triamcinolone ointment (KENALOG) 0.5 % Apply 1 application topically 2 (two) times daily. 30 g Gareth Morgan      PDMP not reviewed this encounter.   Shirlee Latch, PA-C 08/08/21 1553

## 2021-08-08 NOTE — ED Triage Notes (Signed)
Pt c/o bilateral shoulder pain for several weeks. Pt does have arthritis. Pt also c/o rash to both thighs, reports large welts, after sitting outside one day this past weekend. Pt states the areas do itch and seem to be spreading.

## 2021-10-03 ENCOUNTER — Encounter: Payer: Self-pay | Admitting: Physical Therapy

## 2021-10-03 ENCOUNTER — Ambulatory Visit: Payer: Medicare Other | Attending: Orthopedic Surgery | Admitting: Physical Therapy

## 2021-10-03 ENCOUNTER — Other Ambulatory Visit: Payer: Self-pay

## 2021-10-03 DIAGNOSIS — R293 Abnormal posture: Secondary | ICD-10-CM | POA: Insufficient documentation

## 2021-10-03 DIAGNOSIS — M6281 Muscle weakness (generalized): Secondary | ICD-10-CM | POA: Diagnosis present

## 2021-10-03 DIAGNOSIS — G8929 Other chronic pain: Secondary | ICD-10-CM | POA: Diagnosis present

## 2021-10-03 DIAGNOSIS — M25612 Stiffness of left shoulder, not elsewhere classified: Secondary | ICD-10-CM | POA: Insufficient documentation

## 2021-10-03 DIAGNOSIS — M25512 Pain in left shoulder: Secondary | ICD-10-CM | POA: Insufficient documentation

## 2021-10-03 NOTE — Patient Instructions (Signed)
Access Code: [email or web address] URL: [email or web address] Date: 10/03/2021 Prepared by: Dorene Grebe  Exercises  Supine Shoulder Flexion with Dowel - 1 x daily - 5 x weekly - 3 sets - 10 reps Supine Shoulder Circles with Dowel Clockwise - 1 x daily - 5 x weekly - 3 sets - 10 reps Seated Shoulder Flexion AAROM with Pulley Behind - 1 x daily - 5 x weekly - 3 sets - 10 reps Seated Shoulder Abduction AAROM with Pulley Behind - 1 x daily - 5 x weekly - 3 sets - 10 reps Scapular Retraction with Resistance - 1 x daily - 5 x weekly - 3 sets - 10 reps Standing Isometric Shoulder Abduction with Doorway - Arm Bent - 1 x daily - 5 x weekly - 10 reps - 15 sec hold

## 2021-10-03 NOTE — Therapy (Signed)
[facility] [facility] [address] [place], [place], [address] Phone: [phone]   Fax:  [phone]  Physical Therapy Treatment  Patient Details  Name: Virginia Pollard MRN: [id number] Date of Birth: Mar 27, 1946 Referring Provider (PT): Vale Haven MD  Encounter Date: 10/03/2021   PT End of Session - 10/03/21 1557     Visit Number 1    Number of Visits 12    Date for PT Re-Evaluation 11/14/21    Authorization - Visit Number 1    Authorization - Number of Visits 10    Progress Note Due on Visit 10    PT Start Time 1429    PT Stop Time 1514    PT Time Calculation (min) 45 min    Activity Tolerance Patient tolerated treatment well;No increased pain;Patient limited by fatigue;Patient limited by pain    Behavior During Therapy [facility] for tasks assessed/performed             Past Medical History:  Diagnosis Date   Arthritis    Hyperlipidemia    Hypertension     Past Surgical History:  Procedure Laterality Date   TUBAL LIGATION      There were no vitals filed for this visit.   Subjective Assessment - 10/03/21 1553     Subjective Pt presents to tx with c/c of chronic L shoulder pain. Pt is well known to PT clinic with POC to address similar deficits. Pt states that she felt very good and more functional after previous POC before falling in 08/2020. 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 Pt states that she would like to avoid surgery if possible. Pt states benefit with L sh injection on 09/24/2021 and states R shoulder injection scheduled for 10/08/21.  Functional deficits include fixing her hair/ reaching overhead.    Pertinent History ong history of bilat shoulder pain. 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    Limitations Lifting;House hold activities    Diagnostic tests MRI 10/03/2021: OA, L shoulder supraspinatus  tear, and near complete tear of  infraspinatus.    Patient Stated Goals Avoid surgery, life without pain and restriction.    Currently in Pain? Yes    Pain Score 4     Pain Location Shoulder    Pain Orientation Right;Left    Pain Descriptors / Indicators Aching;Sharp;Shooting;Tightness    Pain Type Chronic pain    Pain Radiating Towards n/a    Pain Onset More than a month ago    Pain Frequency Constant    Aggravating Factors  Movement, lifting greater then 5 #    Pain Relieving Factors NSIAD, self massage sleeve, green tea, ice.    Effect of Pain on Daily Activities limited to no overhead activity.              SUBJECTIVE Chief complaint: Pt presents to tx with c/c of chronic L shoulder pain. Pt is well know to PT clinic with POC to address similar deficits. Pt states that she felt very good and functional after previous POC before falling in 08/2020. 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 Pt states that she would like to avoid surgery if possible. Pt states benefit with L injection on 09/24/2021. Pt states R shoulder injection 10/08/21.  Functional deficits include fixing her hair. History: long history of bilat shoulder pain. MRI 09/18/2021 displays full thickness  tear of L supraspinatus.   Referring Dx: Tear of Left Rotator Cuff  Referring Provider: Vale Haven MD  Pain location: L shoulder, lateral arm  Pain: Present 4/10, Best 1-2/10, Worst 10/10: Pain quality: Ache, Sharp, stiff  Radiating pain: none  Numbness/Tingling: none 24 hour pain behavior: Increased stiffness in the morning.  Aggravating factors: Movement, lifting greater then 5 #. Easing factors: NSIAD, self massage sleeve, green tea, ice.  History of  injury, pain, surgery, or therapy:  Follow-up appointment with MD: No injury,  Dominant hand: R handed Imaging: MRI 10/03/2021: OA, L shoulder supraspinatus  tear, and near complete tear of infraspinatus.    Falls in the last 6 months:  06/2021 Occupational demands: Semi-retired, would like to return working in December.  Hobbies: [place], draw, hula-hoop  Goals: Avoid surgery, life without pain and restriction.  Red flags (bowel/bladder changes, saddle paresthesia, personal history of cancer, chills/fever, night sweats, unrelenting pain, first onset of insidious LBP <20 y/o) Negative    OBJECTIVE  FOTO: 41 with a predicted improvement value of 58  Mental Status Patient is oriented to person, place and time.  Recent memory is intact.  Remote memory is intact.  Attention span and concentration are intact.  Expressive speech is intact.  Patient's fund of knowledge is within normal limits for educational level.  SENSATION: Grossly intact to light touch bilateral LEs as determined by testing upper quarter dermatomes    MUSCULOSKELETAL: Tremor: None Bulk: Normal Tone: Normal   Posture Slight upper cross, able to correct with verbal cueing.      Palpation - Moderated TtP to L supraspinatus, infraspinatus, and teres minor.       Strength R/L 4-/2+ Shoulder flexion (anterior deltoid/pec major/coracobrachialis, axillary n. (C5-6) and musculocutaneous n. (C5-7)) 4-/2+ Shoulder abduction (deltoid/supraspinatus, axillary/suprascapular n, C5) 4/4+ Shoulder external rotation (infraspinatus/teres minor) 4/3+ Shoulder internal rotation (subcapularis/lats/pec major) 4+/5 Elbow flexion (biceps brachii, brachialis, brachioradialis, musculoskeletal n, C5-6) 4/4+ Elbow extension (triceps, radial n, C7)   Grip R/L: 56.8/57.3  AROM R/L 161/44 Shoulder flexion (L shoulder PROM 161) 147/36 Shoulder abduction (L shoulder PROM 89) 90/46 Shoulder external rotation 31/64 Shoulder internal rotation *Indicates pain, overpressure performed unless otherwise indicated    Passive Accessory l Motion (PAM) General bilat GH hypomobility with inferior and posterior assessments. No pain.     Access Code: [email or web address] URL:  [email or web address] Date: 10/03/2021 Prepared by: Dorene Grebe  Exercises  Supine Shoulder Flexion with Dowel - 1 x daily - 5 x weekly - 3 sets - 10 reps Supine Shoulder Circles with Dowel Clockwise - 1 x daily - 5 x weekly - 3 sets - 10 reps Seated Shoulder Flexion AAROM with Pulley Behind - 1 x daily - 5 x weekly - 3 sets - 10 reps Seated Shoulder Abduction AAROM with Pulley Behind - 1 x daily - 5 x weekly - 3 sets - 10 reps Scapular Retraction with Resistance - 1 x daily - 5 x weekly - 3 sets - 10 reps Standing Isometric Shoulder Abduction with Doorway - Arm Bent - 1 x daily - 5 x weekly - 10 reps - 15 sec hold       PT Education - 10/03/21 1556     Education Details Pt was educated on clinical findings, POC, HEP, and prognosis    Person(s) Educated Patient    Methods Explanation;Demonstration;Tactile cues;Verbal cues    Comprehension Verbalized understanding                 PT Long  Term Goals - 10/03/21 1527       PT LONG TERM GOAL #1   Title Pt will improve FOTO score to 61 to display pt improvements in ability to perform ADL's.    Baseline 46    Time 6    Period Weeks    Status New    Target Date 11/14/21      PT LONG TERM GOAL #2   Title Pt will increase L shoulder flexion/ abduction AROM to 90 deg. to promote greater functional mobility with overhead tasks    Baseline 161/44 Shoulder flexion (L shoulder PROM 161)  147/36 Shoulder abduction (L shoulder PROM 89)  90/46 Shoulder external rotation  31/64 Shoulder internal rotation    Time 6    Period Weeks    Status New    Target Date 11/14/21      PT LONG TERM GOAL #3   Title Pt will decrease worse pain to 7/10 to allow greater access to functional ADLs    Baseline Present 4/10, Best 1-2/10, Worst 10/10    Time 6    Period Weeks    Status New    Target Date 11/14/21      PT LONG TERM GOAL #4   Title Pt will increase strength of by at least 1/2 MMT grade in order to demonstrate  improvement in strength and function.    Baseline 4-/2+ Shoulder flexion,  4-/2+ Shoulder abduction,  4/4+ Shoulder external rotation,  4/3+ Shoulder internal rotation, 4+/5 Elbow flexion, 4/4+ Elbow extension.    Time 6    Period Weeks    Status New    Target Date 11/14/21      PT LONG TERM GOAL #5   Time 0                   Plan - 10/03/21 1558     Clinical Impression Statement Pt is a pleasant 75 year-old female referred for: Tear of Left Rotator Cuff . PT examination reveals the following deficits: FOTO: 46 with predicted improvement value of 61.  UE ROM: 161/44 Shoulder flexion (L shoulder PROM 161), 147/36 Shoulder abduction (L shoulder PROM 89 deg.), 90/46 Shoulder external rotation, 31/64 Shoulder internal rotation. NPS: Present 4/10, Best 1-2/10, Worst 10/10. UE MMT: 4-/2+ Shoulder flexion, 4-/2+ Shoulder abduction, 4/4+ Shoulder external rotation, 4/3+ Shoulder internal rotation, 4+/5 Elbow flexion, 4/4+ Elbow extension. Pt case displays as moderate complexity with fair prognosis for optimal return to PLOF due to time from onset, current level of daily physically activity, PMH, and PLOF. Pt will benefit from skilled PT 2x/week for 6 weeks to address deficits and promote safe independence with community and home related mobility and ADLs.    Personal Factors and Comorbidities Comorbidity 2;Time since onset of injury/illness/exacerbation;Age;Fitness    Examination-Activity Limitations Carry;Reach Overhead;Lift;Bathing;Sleep    Examination-Participation Restrictions Yard Work;Occupation;Volunteer    Stability/Clinical Decision Making Evolving/Moderate complexity    Clinical Decision Making Moderate    Rehab Potential Fair    PT Frequency 2x / week    PT Duration 6 weeks    PT Treatment/Interventions ADLs/Self Care Home Management;Cryotherapy;Functional mobility training;Therapeutic activities;Therapeutic exercise;Neuromuscular re-education;Patient/family education;Manual  techniques;Passive range of motion;Dry needling;Taping;Moist Heat;Electrical Stimulation    PT Next Visit Plan reasssess HEP adherence.    PT Home Exercise Plan [email or web address]    Consulted and Agree with Plan of Care Patient             Patient will benefit from skilled therapeutic intervention in order to  improve the following deficits and impairments:  Decreased strength, Decreased range of motion, Pain, Impaired flexibility, Decreased activity tolerance, Improper body mechanics, Impaired UE functional use, Decreased coordination, Decreased endurance, Decreased mobility, Decreased safety awareness, Hypomobility, Increased muscle spasms, Impaired perceived functional ability, Postural dysfunction  Visit Diagnosis: Chronic left shoulder pain  Decreased range of motion of left shoulder  Abnormal posture  Muscle weakness (generalized)     Problem List There are no problems to display for this patient.  Cammie Mcgee, PT, DPT # [id number] Lawernce Ion, SPT 10/04/2021, 9:03 AM  [facility] [address] [place], [place], [address] Phone: [phone]   Fax:  [phone]  Name: Virginia Pollard MRN: [id number] Date of Birth: [birth year]

## 2021-10-10 ENCOUNTER — Ambulatory Visit: Payer: Medicare Other

## 2021-10-10 ENCOUNTER — Encounter: Payer: Self-pay | Admitting: Physical Therapy

## 2021-10-10 ENCOUNTER — Other Ambulatory Visit: Payer: Self-pay

## 2021-10-10 DIAGNOSIS — M6281 Muscle weakness (generalized): Secondary | ICD-10-CM

## 2021-10-10 DIAGNOSIS — M25612 Stiffness of left shoulder, not elsewhere classified: Secondary | ICD-10-CM

## 2021-10-10 DIAGNOSIS — R293 Abnormal posture: Secondary | ICD-10-CM

## 2021-10-10 DIAGNOSIS — M25512 Pain in left shoulder: Secondary | ICD-10-CM | POA: Diagnosis not present

## 2021-10-10 DIAGNOSIS — G8929 Other chronic pain: Secondary | ICD-10-CM

## 2021-10-10 NOTE — Therapy (Signed)
[facility] [address] [place], [place], [address] Phone: [phone]   Fax:  [phone]  Physical Therapy Treatment  Patient Details  Name: Virginia Pollard MRN: [id number] Date of Birth: [birth year] Referring Provider (PT): Vale Haven MD   Encounter Date: 10/10/2021   PT End of Session - 10/10/21 1514     Visit Number 2    Number of Visits 12    Date for PT Re-Evaluation 11/14/21    Authorization - Visit Number 2    Authorization - Number of Visits 10    Progress Note Due on Visit 10    PT Start Time 1430    PT Stop Time 1510    PT Time Calculation (min) 40 min    Activity Tolerance Patient tolerated treatment well;No increased pain    Behavior During Therapy WFL for tasks assessed/performed             Past Medical History:  Diagnosis Date   Arthritis    Hyperlipidemia    Hypertension     Past Surgical History:  Procedure Laterality Date   TUBAL LIGATION      There were no vitals filed for this visit.   Subjective Assessment - 10/10/21 1429     Subjective Pt presents with 5/10 NPS in L shoulder. Been compliant with HEP.    Pertinent History ong history of bilat shoulder pain. 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    Patient Stated Goals Avoid surgery, life without pain and restriction.    Currently in Pain? Yes    Pain Score 5     Pain Location Shoulder    Pain Onset More than a month ago             There.ex:   Shoulder pulley's: Flexion then scaption, 2x20. Cuing for decreasing speed. Good form, no pain.    Standing scap retractions with RTB: 2x20. Required cuing for elbows flexed versus elbows extended. Good form after cuing    L shoulder abd isometric into towel. Required cuing for form/technique and  purpose of exercise. Initially reported pain but after education, no pain with good understanding.    Seated AAROM shoulder flexion on blue physio ball: 2x20    2x20 in scaption to the L/R    Seated B shoulder ER with YTB: 1x15, limited AROM noted   Seated shoulder ER AAROM with dowel: 2x20. Cuing for form/technique.     PT Long Term Goals - 10/03/21 1527       PT LONG TERM GOAL #1   Title Pt will improve FOTO score to 61 to display pt improvements in ability to perform ADL's.    Baseline 46    Time 6    Period Weeks    Status New    Target Date 11/14/21      PT LONG TERM GOAL #2   Title Pt will increase L shoulder flexion/ abduction AROM to 90 deg. to promote greater functional mobility with overhead tasks    Baseline 161/44 Shoulder flexion (L shoulder PROM 161)  147/36 Shoulder abduction (L shoulder PROM 89)  90/46 Shoulder external rotation  31/64 Shoulder internal rotation    Time 6    Period Weeks    Status New    Target Date 11/14/21  PT LONG TERM GOAL #3   Title Pt will decrease worse pain to 7/10 to allow greater access to functional ADLs    Baseline Present 4/10, Best 1-2/10, Worst 10/10    Time 6    Period Weeks    Status New    Target Date 11/14/21      PT LONG TERM GOAL #4   Title Pt will increase strength of by at least 1/2 MMT grade in order to demonstrate improvement in strength and function.    Baseline 4-/2+ Shoulder flexion,  4-/2+ Shoulder abduction,  4/4+ Shoulder external rotation,  4/3+ Shoulder internal rotation, 4+/5 Elbow flexion, 4/4+ Elbow extension.    Time 6    Period Weeks    Status New    Target Date 11/14/21      PT LONG TERM GOAL #5   Time 0                   Plan - 10/10/21 1515     Clinical Impression Statement Pt remains very motivated to improve L shoulder pain and mobility. Pt does require re-education for form/tehcnique with exercises but does display good carryover after re-ed. Progressed pt in ROM exercises and  strengthening with no increase in her symptoms. Pt presents with limited shoulder ER on LUE but able to palpate supraspinatus activation. Educated on AAROM with wooden dowel to progress shoulder ER. Added to HEP. Pt will continue to benefit from skilled PT services to improve shoulder strength and mobility to optimize return to performing overhead ADL's.    Personal Factors and Comorbidities Comorbidity 2;Time since onset of injury/illness/exacerbation;Age;Fitness    Examination-Activity Limitations Carry;Reach Overhead;Lift;Bathing;Sleep    Examination-Participation Restrictions Yard Work;Occupation;Volunteer    Stability/Clinical Decision Making Evolving/Moderate complexity    Rehab Potential Fair    PT Frequency 2x / week    PT Duration 6 weeks    PT Treatment/Interventions ADLs/Self Care Home Management;Cryotherapy;Functional mobility training;Therapeutic activities;Therapeutic exercise;Neuromuscular re-education;Patient/family education;Manual techniques;Passive range of motion;Dry needling;Taping;Moist Heat;Electrical Stimulation    PT Next Visit Plan L shoulder ER AAROM    PT Home Exercise Plan [email or web address]    Consulted and Agree with Plan of Care Patient             Patient will benefit from skilled therapeutic intervention in order to improve the following deficits and impairments:  Decreased strength, Decreased range of motion, Pain, Impaired flexibility, Decreased activity tolerance, Improper body mechanics, Impaired UE functional use, Decreased coordination, Decreased endurance, Decreased mobility, Decreased safety awareness, Hypomobility, Increased muscle spasms, Impaired perceived functional ability, Postural dysfunction  Visit Diagnosis: Chronic left shoulder pain  Decreased range of motion of left shoulder  Abnormal posture  Muscle weakness (generalized)     Problem List There are no problems to display for this patient.   Delphia Grates. Fairly IV, PT, DPT Physical  Therapist- [place]  [facility]  10/10/2021, 3:19 PM   [facility] [address] [place], [place], [address] Phone: [phone]   Fax:  [phone]  Name: Virginia Pollard MRN: [id number] Date of Birth: Apr 02, 1946

## 2021-10-16 ENCOUNTER — Encounter: Payer: Self-pay | Admitting: Physical Therapy

## 2021-10-16 ENCOUNTER — Ambulatory Visit: Payer: Medicare Other | Admitting: Physical Therapy

## 2021-10-16 ENCOUNTER — Other Ambulatory Visit: Payer: Self-pay

## 2021-10-16 DIAGNOSIS — G8929 Other chronic pain: Secondary | ICD-10-CM

## 2021-10-16 DIAGNOSIS — M25512 Pain in left shoulder: Secondary | ICD-10-CM | POA: Diagnosis not present

## 2021-10-16 DIAGNOSIS — R293 Abnormal posture: Secondary | ICD-10-CM

## 2021-10-16 DIAGNOSIS — M25612 Stiffness of left shoulder, not elsewhere classified: Secondary | ICD-10-CM

## 2021-10-16 DIAGNOSIS — M6281 Muscle weakness (generalized): Secondary | ICD-10-CM

## 2021-10-16 NOTE — Therapy (Signed)
[facility] [address] [place], [place], [address] Phone: [phone]   Fax:  [phone]  Physical Therapy Treatment  Patient Details  Name: Virginia Pollard MRN: [id number] Date of Birth: [birth year] Referring Provider (PT): Vale Haven MD   Encounter Date: 10/16/2021   PT End of Session - 10/16/21 1431     Visit Number 3    Number of Visits 12    Date for PT Re-Evaluation 11/14/21    Authorization - Visit Number 3    Authorization - Number of Visits 10    Progress Note Due on Visit 10    PT Start Time 1430    PT Stop Time 1515    PT Time Calculation (min) 45 min    Activity Tolerance Patient tolerated treatment well;No increased pain    Behavior During Therapy WFL for tasks assessed/performed             Past Medical History:  Diagnosis Date   Arthritis    Hyperlipidemia    Hypertension     Past Surgical History:  Procedure Laterality Date   TUBAL LIGATION      There were no vitals filed for this visit.   Subjective Assessment - 10/16/21 1430     Subjective Pt. states she is doing better overall and reports 4/10 L shoulder pain while doing pulley ex.  Pt. states she has been doing better since starting PT and has been focusing on L sh. ER since last PT tx.  Pt. reports difficulty with the dowel rod ex.    Pertinent History ong history of bilat shoulder pain. 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    Patient Stated Goals Avoid surgery, life without pain and restriction.    Currently in Pain? Yes    Pain Score 4     Pain Location Shoulder    Pain Orientation Left    Pain Onset More than a month ago               There.ex:               Shoulder pulley's: Flexion/ 45 deg.scaption/ abduction, 2x20. Cuing to increase  hold.     B UBE 3 min. F/b at consistent cadence.  No pain.     Supine dowel ex. (Chest press/ flexion/ abduction/ ER)- 20x each.  Supine manual isometrics: ER/IR 5x each with 10 sec. holds.  Rhythmic stabs L shoulder 3x 30 sec. (Min.-mod. Resistance).     Seated L sh. AAROM shoulder flexion 10x (limited but increase static holds >90 deg.).                          Supine L sh. Serratus punches 10x2 (tactile/verbal cuing for proper technique).         PT Long Term Goals - 10/03/21 1527       PT LONG TERM GOAL #1   Title Pt will improve FOTO score to 61 to display pt improvements in ability to perform ADL's.    Baseline 46    Time 6    Period Weeks    Status New    Target Date 11/14/21      PT LONG TERM GOAL #2   Title Pt will increase L shoulder flexion/ abduction  AROM to 90 deg. to promote greater functional mobility with overhead tasks    Baseline 161/44 Shoulder flexion (L shoulder PROM 161)  147/36 Shoulder abduction (L shoulder PROM 89)  90/46 Shoulder external rotation  31/64 Shoulder internal rotation    Time 6    Period Weeks    Status New    Target Date 11/14/21      PT LONG TERM GOAL #3   Title Pt will decrease worse pain to 7/10 to allow greater access to functional ADLs    Baseline Present 4/10, Best 1-2/10, Worst 10/10    Time 6    Period Weeks    Status New    Target Date 11/14/21      PT LONG TERM GOAL #4   Title Pt will increase strength of by at least 1/2 MMT grade in order to demonstrate improvement in strength and function.    Baseline 4-/2+ Shoulder flexion,  4-/2+ Shoulder abduction,  4/4+ Shoulder external rotation,  4/3+ Shoulder internal rotation, 4+/5 Elbow flexion, 4/4+ Elbow extension.    Time 6    Period Weeks    Status New    Target Date 11/14/21      PT LONG TERM GOAL #5   Time 0                   Plan - 10/16/21 1816     Clinical Impression Statement Pt. works hard during PT session and focused on increasing L shoulder  AROM/ strengthening to improve ADL.  L shoulder AAROM remains limited to <95 deg. but pt. able to hold L shoulder at >90 deg. independently for 5 sec. before fatigue.  Increase L shoulder ER AROM noted today as compared to last tx. but strengthening limited.  Pt. will continue to benefit from skilled PT services to increase sh. strength/ mobility to improve return to performing overhead ADL.    Personal Factors and Comorbidities Comorbidity 2;Time since onset of injury/illness/exacerbation;Age;Fitness    Examination-Activity Limitations Carry;Reach Overhead;Lift;Bathing;Sleep    Examination-Participation Restrictions Yard Work;Occupation;Volunteer    Stability/Clinical Decision Making Evolving/Moderate complexity    Clinical Decision Making Moderate    Rehab Potential Fair    PT Frequency 2x / week    PT Duration 6 weeks    PT Treatment/Interventions ADLs/Self Care Home Management;Cryotherapy;Functional mobility training;Therapeutic activities;Therapeutic exercise;Neuromuscular re-education;Patient/family education;Manual techniques;Passive range of motion;Dry needling;Taping;Moist Heat;Electrical Stimulation    PT Next Visit Plan L shoulder AAROM/ strengthening ex.    PT Home Exercise Plan [email or web address]    Consulted and Agree with Plan of Care Patient             Patient will benefit from skilled therapeutic intervention in order to improve the following deficits and impairments:  Decreased strength, Decreased range of motion, Pain, Impaired flexibility, Decreased activity tolerance, Improper body mechanics, Impaired UE functional use, Decreased coordination, Decreased endurance, Decreased mobility, Decreased safety awareness, Hypomobility, Increased muscle spasms, Impaired perceived functional ability, Postural dysfunction  Visit Diagnosis: Chronic left shoulder pain  Decreased range of motion of left shoulder  Abnormal posture  Muscle weakness (generalized)     Problem List There are  no problems to display for this patient.  Cammie Mcgee, PT, DPT # [phone] 10/16/2021, 6:21 PM  [place] [facility] [address] [place], [place], [address] Phone: [phone]   Fax:  [phone]  Name: Virginia Pollard MRN: [id number] Date of Birth: 07/06/1946

## 2021-10-18 ENCOUNTER — Other Ambulatory Visit: Payer: Self-pay

## 2021-10-18 ENCOUNTER — Ambulatory Visit: Payer: Medicare Other | Attending: Orthopedic Surgery | Admitting: Physical Therapy

## 2021-10-18 ENCOUNTER — Encounter: Payer: Medicare Other | Admitting: Physical Therapy

## 2021-10-18 DIAGNOSIS — G8929 Other chronic pain: Secondary | ICD-10-CM | POA: Insufficient documentation

## 2021-10-18 DIAGNOSIS — M25512 Pain in left shoulder: Secondary | ICD-10-CM | POA: Diagnosis not present

## 2021-10-18 DIAGNOSIS — R293 Abnormal posture: Secondary | ICD-10-CM | POA: Insufficient documentation

## 2021-10-18 DIAGNOSIS — M25612 Stiffness of left shoulder, not elsewhere classified: Secondary | ICD-10-CM | POA: Insufficient documentation

## 2021-10-18 DIAGNOSIS — M6281 Muscle weakness (generalized): Secondary | ICD-10-CM | POA: Diagnosis present

## 2021-10-20 NOTE — Therapy (Signed)
[facility] [address] [place], [place], [address] Phone: [phone]   Fax:  [phone]  Physical Therapy Treatment  Patient Details  Name: Virginia Pollard MRN: [id number] Date of Birth: 07-13-1946 Referring Provider (PT): [place] MD   Encounter Date: 10/18/2021   PT End of Session - 10/20/21 1413     Visit Number 4    Number of Visits 12    Date for PT Re-Evaluation 11/14/21    Authorization - Visit Number 4    Authorization - Number of Visits 10    Progress Note Due on Visit 10    PT Start Time 1727    PT Stop Time 1811    PT Time Calculation (min) 44 min    Activity Tolerance Patient tolerated treatment well;No increased pain    Behavior During Therapy WFL for tasks assessed/performed             Past Medical History:  Diagnosis Date   Arthritis    Hyperlipidemia    Hypertension     Past Surgical History:  Procedure Laterality Date   TUBAL LIGATION      There were no vitals filed for this visit.   Subjective Assessment - 10/20/21 1411     Subjective Pt. reports good compliance with HEP/ use of pulleys.  Pt. states she is doing better overall and has 4/10 L shoulder pain with use of dowel rod.    Pertinent History ong history of bilat shoulder pain. 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    Patient Stated Goals Avoid surgery, life without pain and restriction.    Currently in Pain? Yes    Pain Score 4     Pain Location Shoulder    Pain Orientation Left    Pain Onset More than a month ago              There.ex:                B UBE 3 min. F/b at consistent cadence.  No pain.    Seated L sh. AAROM shoulder flexion 10x (limited but increase static holds >90 deg.).                          Supine L sh. AROM:  ER/ flexion 5x each.   Supine serratus punches 10x2 (tactile/verbal cuing for proper technique).                Supine bicep curls/ tricep extension/ IR (at 45 deg.)/ ER (at 45 deg.) with 2# dumbbell 20x each.    Supine manual isometrics: ER/IR 5x each with 10 sec. holds.  Rhythmic stabs L shoulder 3x 30 sec. (Min.-mod. Resistance).                  PT Long Term Goals - 10/03/21 1527       PT LONG TERM GOAL #1   Title Pt will improve FOTO score to 61 to display pt improvements in ability to perform ADL's.    Baseline 46    Time 6    Period Weeks    Status New    Target Date 11/14/21      PT LONG TERM GOAL #2   Title Pt will increase L shoulder flexion/  abduction AROM to 90 deg. to promote greater functional mobility with overhead tasks    Baseline 161/44 Shoulder flexion (L shoulder PROM 161)  147/36 Shoulder abduction (L shoulder PROM 89)  90/46 Shoulder external rotation  31/64 Shoulder internal rotation    Time 6    Period Weeks    Status New    Target Date 11/14/21      PT LONG TERM GOAL #3   Title Pt will decrease worse pain to 7/10 to allow greater access to functional ADLs    Baseline Present 4/10, Best 1-2/10, Worst 10/10    Time 6    Period Weeks    Status New    Target Date 11/14/21      PT LONG TERM GOAL #4   Title Pt will increase strength of by at least 1/2 MMT grade in order to demonstrate improvement in strength and function.    Baseline 4-/2+ Shoulder flexion,  4-/2+ Shoulder abduction,  4/4+ Shoulder external rotation,  4/3+ Shoulder internal rotation, 4+/5 Elbow flexion, 4/4+ Elbow extension.    Time 6    Period Weeks    Status New    Target Date 11/14/21      PT LONG TERM GOAL #5   Time 0                   Plan - 10/20/21 1414     Clinical Impression Statement L shoulder AROM limited to <90 deg. with increase in flexion/ ER noted during supine AA/PROM manual tx.  Pt. started light resisted bicep/ tricep ex. in supine position with no  increase c/o pain.  Limited L sh. flexion during rhythmic stabs at 90 deg. flexion in supine position.  Pt. highly motivated and hard working during tx. session.  No changes to HEP today.  Pt. will continue to benefit from skilled PT services to increase sh. strength/ mobility to improve return to overhead ADL.    Personal Factors and Comorbidities Comorbidity 2;Time since onset of injury/illness/exacerbation;Age;Fitness    Examination-Activity Limitations Carry;Reach Overhead;Lift;Bathing;Sleep    Examination-Participation Restrictions Yard Work;Occupation;Volunteer    Stability/Clinical Decision Making Evolving/Moderate complexity    Clinical Decision Making Moderate    Rehab Potential Fair    PT Frequency 2x / week    PT Duration 6 weeks    PT Treatment/Interventions ADLs/Self Care Home Management;Cryotherapy;Functional mobility training;Therapeutic activities;Therapeutic exercise;Neuromuscular re-education;Patient/family education;Manual techniques;Passive range of motion;Dry needling;Taping;Moist Heat;Electrical Stimulation    PT Next Visit Plan L shoulder AAROM/ strengthening ex.    PT Home Exercise Plan [email or web address]    Consulted and Agree with Plan of Care Patient             Patient will benefit from skilled therapeutic intervention in order to improve the following deficits and impairments:  Decreased strength, Decreased range of motion, Pain, Impaired flexibility, Decreased activity tolerance, Improper body mechanics, Impaired UE functional use, Decreased coordination, Decreased endurance, Decreased mobility, Decreased safety awareness, Hypomobility, Increased muscle spasms, Impaired perceived functional ability, Postural dysfunction  Visit Diagnosis: Chronic left shoulder pain  Decreased range of motion of left shoulder  Abnormal posture  Muscle weakness (generalized)     Problem List There are no problems to display for this patient.  Cammie Mcgee, PT, DPT #  [phone] 10/20/2021, 2:29 PM  [facility] [address] [place], [place], [address] Phone: [phone]   Fax:  [phone]  Name: Virginia Pollard MRN: [id number] Date of Birth: 1945-12-14

## 2021-10-22 ENCOUNTER — Encounter: Payer: Medicare Other | Admitting: Physical Therapy

## 2021-10-23 ENCOUNTER — Encounter: Payer: Medicare Other | Admitting: Physical Therapy

## 2021-10-24 ENCOUNTER — Encounter: Payer: Medicare Other | Admitting: Physical Therapy

## 2021-10-25 ENCOUNTER — Ambulatory Visit: Payer: Medicare Other | Admitting: Physical Therapy

## 2021-10-25 ENCOUNTER — Other Ambulatory Visit: Payer: Self-pay

## 2021-10-25 ENCOUNTER — Encounter: Payer: Self-pay | Admitting: Physical Therapy

## 2021-10-25 DIAGNOSIS — M25512 Pain in left shoulder: Secondary | ICD-10-CM

## 2021-10-25 DIAGNOSIS — M6281 Muscle weakness (generalized): Secondary | ICD-10-CM

## 2021-10-25 DIAGNOSIS — M25612 Stiffness of left shoulder, not elsewhere classified: Secondary | ICD-10-CM

## 2021-10-25 DIAGNOSIS — R293 Abnormal posture: Secondary | ICD-10-CM

## 2021-10-25 DIAGNOSIS — G8929 Other chronic pain: Secondary | ICD-10-CM

## 2021-10-25 NOTE — Therapy (Signed)
[facility] [address] [place], [place], [address] Phone: [phone]   Fax:  [phone]  Physical Therapy Treatment  Patient Details  Name: Virginia Pollard MRN: [id number] Date of Birth: 11/04/1946 Referring Provider (PT): [place] MD   Encounter Date: 10/25/2021   PT End of Session - 10/25/21 1050     Visit Number 5    Number of Visits 12    Date for PT Re-Evaluation 11/14/21    Authorization - Visit Number 5    Authorization - Number of Visits 10    Progress Note Due on Visit 10    PT Start Time 1032    PT Stop Time 1106    PT Time Calculation (min) 34 min    Activity Tolerance Patient tolerated treatment well    Behavior During Therapy [facility] for tasks assessed/performed             Past Medical History:  Diagnosis Date   Arthritis    Hyperlipidemia    Hypertension     Past Surgical History:  Procedure Laterality Date   TUBAL LIGATION      There were no vitals filed for this visit.   Subjective Assessment - 10/25/21 1035     Subjective Pt. states she is sleeping well.  Pt. reports L shoulder discomfort due to rain/ weather (arthritis).    Pertinent History ong history of bilat shoulder pain. 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    Patient Stated Goals Avoid surgery, life without pain and restriction.    Currently in Pain? No/denies    Pain Onset More than a month ago                There.ex:                B UBE 4 min. F/b at consistent cadence.  No pain.    Seated L sh. AAROM with wand: shoulder flexion/ chest press/ ER 20x (PT assist at elbow with all AAROM.                Supine L sh. AROM: ER/ flexion 5x each.  Significant limitations with flexion and compensation with increase biceps activation noted.     Supine serratus punches 10x2 (tactile/verbal cuing for proper technique).    Supine manual isometrics: biceps/ triceps/ ER/IR 5x each with 10 sec. holds.  Rhythmic stabs L shoulder 3x 30 sec. (Min.-mod. Resistance).                    PT Long Term Goals - 10/03/21 1527       PT LONG TERM GOAL #1   Title Pt will improve FOTO score to 61 to display pt improvements in ability to perform ADL's.    Baseline 46    Time 6    Period Weeks    Status New    Target Date 11/14/21      PT LONG TERM GOAL #2   Title Pt will increase L shoulder flexion/ abduction AROM to 90 deg. to promote greater functional mobility with overhead tasks    Baseline 161/44 Shoulder flexion (L shoulder PROM 161)  147/36 Shoulder abduction (L shoulder PROM 89)  90/46 Shoulder external rotation  31/64 Shoulder internal rotation    Time 6    Period  Weeks    Status New    Target Date 11/14/21      PT LONG TERM GOAL #3   Title Pt will decrease worse pain to 7/10 to allow greater access to functional ADLs    Baseline Present 4/10, Best 1-2/10, Worst 10/10    Time 6    Period Weeks    Status New    Target Date 11/14/21      PT LONG TERM GOAL #4   Title Pt will increase strength of by at least 1/2 MMT grade in order to demonstrate improvement in strength and function.    Baseline 4-/2+ Shoulder flexion,  4-/2+ Shoulder abduction,  4/4+ Shoulder external rotation,  4/3+ Shoulder internal rotation, 4+/5 Elbow flexion, 4/4+ Elbow extension.    Time 6    Period Weeks    Status New    Target Date 11/14/21      PT LONG TERM GOAL #5   Time 0                   Plan - 10/25/21 1224     Clinical Impression Statement L shoulder AROM remains limited, esp. in seated posture as compared to supine.  Light PT assist required with all aspects of L shoulder AAROM and cuing for pt. to avoid L elbow flexion during shoulder AAROM.  Limited L shoulder strength, esp. with shoulder isometrics in flexion/ abduction  direction.  PT provides minimal manual resistance during isometrics to encouarge proper muscle contraction/ activation.  No change to HEP and pt. will focus on L shoulder/ scapular strengthening to increase strength/ functional mobility with daily tasks.    Personal Factors and Comorbidities Comorbidity 2;Time since onset of injury/illness/exacerbation;Age;Fitness    Examination-Activity Limitations Carry;Reach Overhead;Lift;Bathing;Sleep    Examination-Participation Restrictions Yard Work;Occupation;Volunteer    Stability/Clinical Decision Making Evolving/Moderate complexity    Clinical Decision Making Moderate    Rehab Potential Fair    PT Frequency 2x / week    PT Duration 6 weeks    PT Treatment/Interventions ADLs/Self Care Home Management;Cryotherapy;Functional mobility training;Therapeutic activities;Therapeutic exercise;Neuromuscular re-education;Patient/family education;Manual techniques;Passive range of motion;Dry needling;Taping;Moist Heat;Electrical Stimulation    PT Next Visit Plan L shoulder AAROM/ strengthening ex.    PT Home Exercise Plan [email or web address]    Consulted and Agree with Plan of Care Patient             Patient will benefit from skilled therapeutic intervention in order to improve the following deficits and impairments:  Decreased strength, Decreased range of motion, Pain, Impaired flexibility, Decreased activity tolerance, Improper body mechanics, Impaired UE functional use, Decreased coordination, Decreased endurance, Decreased mobility, Decreased safety awareness, Hypomobility, Increased muscle spasms, Impaired perceived functional ability, Postural dysfunction  Visit Diagnosis: Chronic left shoulder pain  Decreased range of motion of left shoulder  Abnormal posture  Muscle weakness (generalized)     Problem List There are no problems to display for this patient.  Cammie Mcgee, PT, DPT # [phone] 10/25/2021, 12:35 PM  [facility] [place] [facility] [address] [place], [place], [address] Phone: [phone]   Fax:  [phone]  Name: Jessey Huyett MRN: [id number] Date of Birth: 09/09/1946

## 2021-10-29 ENCOUNTER — Encounter: Payer: Medicare Other | Admitting: Physical Therapy

## 2021-10-30 ENCOUNTER — Other Ambulatory Visit: Payer: Self-pay

## 2021-10-30 ENCOUNTER — Ambulatory Visit: Payer: Medicare Other | Admitting: Physical Therapy

## 2021-10-30 DIAGNOSIS — M6281 Muscle weakness (generalized): Secondary | ICD-10-CM

## 2021-10-30 DIAGNOSIS — M25512 Pain in left shoulder: Secondary | ICD-10-CM | POA: Diagnosis not present

## 2021-10-30 DIAGNOSIS — R293 Abnormal posture: Secondary | ICD-10-CM

## 2021-10-30 DIAGNOSIS — M25612 Stiffness of left shoulder, not elsewhere classified: Secondary | ICD-10-CM

## 2021-11-01 ENCOUNTER — Encounter: Payer: Self-pay | Admitting: Physical Therapy

## 2021-11-01 ENCOUNTER — Other Ambulatory Visit: Payer: Self-pay

## 2021-11-01 ENCOUNTER — Ambulatory Visit: Payer: Medicare Other | Admitting: Physical Therapy

## 2021-11-01 ENCOUNTER — Encounter: Payer: Medicare Other | Admitting: Physical Therapy

## 2021-11-01 DIAGNOSIS — M25512 Pain in left shoulder: Secondary | ICD-10-CM | POA: Diagnosis not present

## 2021-11-01 DIAGNOSIS — R293 Abnormal posture: Secondary | ICD-10-CM

## 2021-11-01 DIAGNOSIS — M6281 Muscle weakness (generalized): Secondary | ICD-10-CM

## 2021-11-01 DIAGNOSIS — M25612 Stiffness of left shoulder, not elsewhere classified: Secondary | ICD-10-CM

## 2021-11-01 NOTE — Therapy (Signed)
[facility] [address] [place], [place], [address] Phone: [phone]   Fax:  [phone]  Physical Therapy Treatment  Patient Details  Name: [place] Pollard MRN: [id number] Date of Birth: 1945-12-21 Referring Provider (PT): [place] Haven MD   Encounter Date: 10/30/2021   PT End of Session - 11/01/21 0842     Visit Number 6    Number of Visits 12    Date for PT Re-Evaluation 11/14/21    Authorization - Visit Number 6    Authorization - Number of Visits 10    Progress Note Due on Visit 10    PT Start Time 1643    PT Stop Time 1730    PT Time Calculation (min) 47 min    Activity Tolerance Patient tolerated treatment well    Behavior During Therapy [facility] for tasks assessed/performed             Past Medical History:  Diagnosis Date   Arthritis    Hyperlipidemia    Hypertension     Past Surgical History:  Procedure Laterality Date   TUBAL LIGATION      There were no vitals filed for this visit.   Subjective Assessment - 11/01/21 0839     Subjective Pt. reports no new complaints.  Pt. remains limited with L shoulder AROM/ discomfort.    Pertinent History ong history of bilat shoulder pain. 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    Patient Stated Goals Avoid surgery, life without pain and restriction.    Currently in Pain? Yes    Pain Score 4     Pain Location Shoulder    Pain Orientation Left    Pain Onset More than a month ago             There.ex:                B UBE 4 min. F/b at consistent cadence.  No increase pain/ L sh. Crepitus noted.     Nautilus: 30# seated lat. Pull downs with wand 15x2/ 20# standing tricep extension 15x2/ 30# standing scapular retraction 15x2.  Supine L sh. AROM: ER/ flexion 5x each.     Supine serratus punches 10x2 (tactile/verbal cuing for proper technique).   Supine manual isometrics: ER/IR 5x each with 10 sec. holds.  Rhythmic stabs L shoulder 3x 30 sec. (Min.-mod. Resistance).         Manual tx.:  Supine L shoulder AA/PROM all planes of movement 10x each.    STM to L UT/ deltoid/ proximal biceps at neutral position.     No change to HEP.              PT Long Term Goals - 10/03/21 1527       PT LONG TERM GOAL #1   Title Pt will improve FOTO score to 61 to display pt improvements in ability to perform ADL's.    Baseline 46    Time 6    Period Weeks    Status New    Target Date 11/14/21      PT LONG TERM GOAL #2   Title Pt will increase L shoulder flexion/ abduction AROM to 90 deg. to promote greater functional mobility with overhead tasks    Baseline 161/44 Shoulder flexion (L shoulder PROM 161)  147/36 Shoulder abduction (L shoulder PROM 89)  90/46 Shoulder external rotation  31/64 Shoulder internal rotation    Time 6    Period Weeks    Status New    Target Date 11/14/21      PT LONG TERM GOAL #3   Title Pt will decrease worse pain to 7/10 to allow greater access to functional ADLs    Baseline Present 4/10, Best 1-2/10, Worst 10/10    Time 6    Period Weeks    Status New    Target Date 11/14/21      PT LONG TERM GOAL #4   Title Pt will increase strength of by at least 1/2 MMT grade in order to demonstrate improvement in strength and function.    Baseline 4-/2+ Shoulder flexion,  4-/2+ Shoulder abduction,  4/4+ Shoulder external rotation,  4/3+ Shoulder internal rotation, 4+/5 Elbow flexion, 4/4+ Elbow extension.    Time 6    Period Weeks    Status New    Target Date 11/14/21      PT LONG TERM GOAL #5   Time 0                   Plan - 11/01/21 0843     Clinical Impression Statement L shoulder AROM limited to <90 deg. in seated posture.  PT assist required with all aspects of L shoulder AA/PROM and cuing for pt. to avoid L elbow  flexion during shoulder AAROM. Limited L shoulder strength, esp. with shoulder isometrics in flexion/ abduction direction. PT provides minimal manual resistance during isometrics to encouarge proper muscle contraction/ activation. Pt. instructed to continue to use L UE with daily household activities in a pain tolerable range.   Pt. will continue to benefit from skilled PT services to increaes L shoulder/ scapular strengthening to improve functional mobility with daily tasks.    Personal Factors and Comorbidities Comorbidity 2;Time since onset of injury/illness/exacerbation;Age;Fitness    Examination-Activity Limitations Carry;Reach Overhead;Lift;Bathing;Sleep    Examination-Participation Restrictions Yard Work;Occupation;Volunteer    Stability/Clinical Decision Making Evolving/Moderate complexity    Clinical Decision Making Moderate    Rehab Potential Fair    PT Frequency 2x / week    PT Duration 6 weeks    PT Treatment/Interventions ADLs/Self Care Home Management;Cryotherapy;Functional mobility training;Therapeutic activities;Therapeutic exercise;Neuromuscular re-education;Patient/family education;Manual techniques;Passive range of motion;Dry needling;Taping;Moist Heat;Electrical Stimulation    PT Next Visit Plan L shoulder AAROM/ strengthening ex.    PT Home Exercise Plan [email or web address]    Consulted and Agree with Plan of Care Patient             Patient will benefit from skilled therapeutic intervention in order to improve the following deficits and impairments:  Decreased strength, Decreased range of motion, Pain, Impaired flexibility, Decreased activity tolerance, Improper body mechanics, Impaired UE functional use, Decreased coordination, Decreased endurance, Decreased mobility, Decreased safety awareness, Hypomobility, Increased muscle spasms, Impaired perceived functional ability, Postural dysfunction  Visit Diagnosis: Chronic left shoulder pain  Decreased range of motion of left  shoulder  Abnormal posture  Muscle weakness (generalized)     Problem List There are no problems to display for this patient.  Cammie Mcgee, PT, DPT # [phone] 11/01/2021, 8:47 AM  [facility] [address] [place], [place], [address] Phone: [phone]   Fax:  [phone]  Name: Yassmine Tamm MRN: [id number] Date of Birth: 03-Aug-1946

## 2021-11-01 NOTE — Therapy (Signed)
[facility] [address] [place], [place], [address] Phone: [phone]   Fax:  [phone]  Physical Therapy Treatment  Patient Details  Name: Virginia Pollard MRN: [id number] Date of Birth: October 17, 1946 Referring Provider (PT): Vale Haven MD   Encounter Date: 11/01/2021   PT End of Session - 11/01/21 1645     Visit Number 7    Number of Visits 12    Date for PT Re-Evaluation 11/14/21    Authorization - Visit Number 7    Authorization - Number of Visits 10    Progress Note Due on Visit 10    PT Start Time 1646    PT Stop Time 1729    PT Time Calculation (min) 43 min    Activity Tolerance Patient tolerated treatment well    Behavior During Therapy [facility] for tasks assessed/performed             Past Medical History:  Diagnosis Date   Arthritis    Hyperlipidemia    Hypertension     Past Surgical History:  Procedure Laterality Date   TUBAL LIGATION      There were no vitals filed for this visit.   Subjective Assessment - 11/01/21 1645     Subjective Pt. entered PT with no new complaints.  Pt. very motivated to increase L shoulder ROM/ strengthening and avoid surgery.    Pertinent History ong history of bilat shoulder pain. 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    Patient Stated Goals Avoid surgery, life without pain and restriction.    Currently in Pain? Yes    Pain Score 4     Pain Location Shoulder    Pain Orientation Left    Pain Descriptors / Indicators Aching    Pain Type Chronic pain    Pain Onset More than a month ago              There.ex:     Standing yellow ball at wall: shoulder flexion 10x with hold.  Light to no PT assist.                B UBE 4 min. F/b at consistent cadence.  No increase pain/ L sh.  Crepitus noted.     Nautilus: 30# seated lat. Pull downs with wand 15x2/ 20# standing tricep extension 15x2/ 40# standing scapular retraction 15x2.    3# standing bicep curls 15x2.    Supine L sh. AROM: ER/ flexion 5x each.    Supine serratus punches 10x2 (tactile/verbal cuing for proper technique).   Supine rhythmic stabs L shoulder 3x 30 sec. (Min.-mod. Resistance).           Manual tx.:   Supine L shoulder AA/PROM all planes of movement 10x each.     STM to L UT/ deltoid/ proximal biceps at neutral position.       Pt. Will use 3# dumbbell with HEP           PT Long Term Goals - 10/03/21 1527       PT LONG TERM GOAL #1   Title Pt will improve FOTO score to 61 to display pt improvements in ability to perform ADL's.    Baseline 46    Time 6    Period Weeks    Status New  Target Date 11/14/21      PT LONG TERM GOAL #2   Title Pt will increase L shoulder flexion/ abduction AROM to 90 deg. to promote greater functional mobility with overhead tasks    Baseline 161/44 Shoulder flexion (L shoulder PROM 161)  147/36 Shoulder abduction (L shoulder PROM 89)  90/46 Shoulder external rotation  31/64 Shoulder internal rotation    Time 6    Period Weeks    Status New    Target Date 11/14/21      PT LONG TERM GOAL #3   Title Pt will decrease worse pain to 7/10 to allow greater access to functional ADLs    Baseline Present 4/10, Best 1-2/10, Worst 10/10    Time 6    Period Weeks    Status New    Target Date 11/14/21      PT LONG TERM GOAL #4   Title Pt will increase strength of by at least 1/2 MMT grade in order to demonstrate improvement in strength and function.    Baseline 4-/2+ Shoulder flexion,  4-/2+ Shoulder abduction,  4/4+ Shoulder external rotation,  4/3+ Shoulder internal rotation, 4+/5 Elbow flexion, 4/4+ Elbow extension.    Time 6    Period Weeks    Status New    Target Date 11/14/21      PT LONG TERM GOAL #5   Time 0                   Plan -  11/02/21 1735     Clinical Impression Statement Pt. demonstrates improved ability to staticlly hold L shoulder overhead/ >100 deg. after A/AROM assist in seated and standing posture.  Pt. showing progress with overhead ball/ wall ladder ex. with L shoulder.  Tx. focus on strengthening ex. at Nautilus and use of 3# wt. for biceps.  Pt. highly motivated and will use 3# dumbbell with HEP.    Personal Factors and Comorbidities Comorbidity 2;Time since onset of injury/illness/exacerbation;Age;Fitness    Examination-Activity Limitations Carry;Reach Overhead;Lift;Bathing;Sleep    Examination-Participation Restrictions Yard Work;Occupation;Volunteer    Stability/Clinical Decision Making Evolving/Moderate complexity    Clinical Decision Making Moderate    Rehab Potential Fair    PT Frequency 2x / week    PT Duration 6 weeks    PT Treatment/Interventions ADLs/Self Care Home Management;Cryotherapy;Functional mobility training;Therapeutic activities;Therapeutic exercise;Neuromuscular re-education;Patient/family education;Manual techniques;Passive range of motion;Dry needling;Taping;Moist Heat;Electrical Stimulation    PT Next Visit Plan L shoulder AAROM/ strengthening ex.   Issue strengthening ex. program with 3# wt.    PT Home Exercise Plan [email or web address]    Consulted and Agree with Plan of Care Patient             Patient will benefit from skilled therapeutic intervention in order to improve the following deficits and impairments:  Decreased strength, Decreased range of motion, Pain, Impaired flexibility, Decreased activity tolerance, Improper body mechanics, Impaired UE functional use, Decreased coordination, Decreased endurance, Decreased mobility, Decreased safety awareness, Hypomobility, Increased muscle spasms, Impaired perceived functional ability, Postural dysfunction  Visit Diagnosis: Chronic left shoulder pain  Decreased range of motion of left shoulder  Abnormal posture  Muscle weakness  (generalized)     Problem List There are no problems to display for this patient.  Cammie Mcgee, PT, DPT # [phone] 11/02/2021, 6:30 PM  [facility] [address] [place], [place], [address] Phone: [phone]   Fax:  [phone]  Name: Joycelynn Fritsche MRN: [id number] Date of Birth:  11/06/1946 ° ° ° °

## 2021-11-05 ENCOUNTER — Encounter: Payer: Medicare Other | Admitting: Physical Therapy

## 2021-11-06 ENCOUNTER — Encounter: Payer: Self-pay | Admitting: Physical Therapy

## 2021-11-06 ENCOUNTER — Ambulatory Visit: Payer: Medicare Other | Admitting: Physical Therapy

## 2021-11-06 ENCOUNTER — Other Ambulatory Visit: Payer: Self-pay

## 2021-11-06 DIAGNOSIS — M25512 Pain in left shoulder: Secondary | ICD-10-CM | POA: Diagnosis not present

## 2021-11-06 DIAGNOSIS — M25612 Stiffness of left shoulder, not elsewhere classified: Secondary | ICD-10-CM

## 2021-11-06 DIAGNOSIS — M6281 Muscle weakness (generalized): Secondary | ICD-10-CM

## 2021-11-06 DIAGNOSIS — R293 Abnormal posture: Secondary | ICD-10-CM

## 2021-11-06 NOTE — Therapy (Signed)
[facility] [address] [place], [place], [address] Phone: [phone]   Fax:  [phone]  Physical Therapy Treatment  Patient Details  Name: Virginia Pollard MRN: [id number] Date of Birth: [birth year] Referring Provider (PT): Vale Haven MD   Encounter Date: 11/06/2021    PT End of Session - 11/06/21 1646     Visit Number 8    Number of Visits 12    Date for PT Re-Evaluation 11/14/21    Authorization - Visit Number 8    Authorization - Number of Visits 10    Progress Note Due on Visit 10    PT Start Time 1645 to 1731   Activity Tolerance Patient tolerated treatment well    Behavior During Therapy [facility] for tasks assessed/performed             Past Medical History:  Diagnosis Date   Arthritis    Hyperlipidemia    Hypertension     Past Surgical History:  Procedure Laterality Date   TUBAL LIGATION      There were no vitals filed for this visit.   Subjective Assessment - 11/06/21 1645     Subjective Pt. reports no L shoulder pain currently.  Pt. has "aching" in L shoulder at night but not during the day.    Pertinent History ong history of bilat shoulder pain. 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    Patient Stated Goals Avoid surgery, life without pain and restriction.    Currently in Pain? No/denies    Pain Onset More than a month ago                    There.ex:                B UBE 4 min. F/b at consistent cadence.  No increase pain.     Standing wall ladder: L shoulder to height of sticker or 1 notch higher 5x.     Nautilus: 40# seated lat. Pull downs with wand 15x2/ 30# standing tricep extension 15x2/ 40# standing scapular retraction 15x2.               3# seated bicep curls 15x2.     Supine L sh. AROM:  ER/ flexion 5x each.   Supine wand: chest press 20x.     Supine serratus punches 10x2 (tactile/verbal cuing for proper technique).   Supine rhythmic stabs L shoulder 3x 30 sec. (Min.-mod. Resistance).           Manual tx.:   Supine L shoulder AA/PROM all planes of movement 10x each.     STM to L UT/ deltoid/ proximal biceps at neutral position.             PT Long Term Goals - 10/03/21 1527       PT LONG TERM GOAL #1   Title Pt will improve FOTO score to 61 to display pt improvements in ability to perform ADL's.    Baseline 46    Time 6    Period Weeks    Status New    Target Date 11/14/21      PT LONG TERM GOAL #2   Title Pt will increase L shoulder flexion/ abduction AROM to 90 deg. to promote greater functional mobility with overhead tasks  Baseline 161/44 Shoulder flexion (L shoulder PROM 161)  147/36 Shoulder abduction (L shoulder PROM 89)  90/46 Shoulder external rotation  31/64 Shoulder internal rotation    Time 6    Period Weeks    Status New    Target Date 11/14/21      PT LONG TERM GOAL #3   Title Pt will decrease worse pain to 7/10 to allow greater access to functional ADLs    Baseline Present 4/10, Best 1-2/10, Worst 10/10    Time 6    Period Weeks    Status New    Target Date 11/14/21      PT LONG TERM GOAL #4   Title Pt will increase strength of by at least 1/2 MMT grade in order to demonstrate improvement in strength and function.    Baseline 4-/2+ Shoulder flexion,  4-/2+ Shoulder abduction,  4/4+ Shoulder external rotation,  4/3+ Shoulder internal rotation, 4+/5 Elbow flexion, 4/4+ Elbow extension.    Time 6    Period Weeks    Status New    Target Date 11/14/21      PT LONG TERM GOAL #5   Time 0             Pt. motivated and works hard during tx. session. Tx. focus on L shoulder strengthening/ functional reaching/ overhead tasks. 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 has demonstrated ability to manage L  shoulder below 90 deg. flexion. Pt. will continue to benefit from skilled PT services to increase L shoulder mobility/ strength to improve functional and work-related tasks.        Patient will benefit from skilled therapeutic intervention in order to improve the following deficits and impairments:  Decreased strength, Decreased range of motion, Pain, Impaired flexibility, Decreased activity tolerance, Improper body mechanics, Impaired UE functional use, Decreased coordination, Decreased endurance, Decreased mobility, Decreased safety awareness, Hypomobility, Increased muscle spasms, Impaired perceived functional ability, Postural dysfunction  Visit Diagnosis: Chronic left shoulder pain  Decreased range of motion of left shoulder  Abnormal posture  Muscle weakness (generalized)     Problem List There are no problems to display for this patient.  Cammie Mcgee, PT, DPT # [phone] 11/08/2021, 9:19 AM  [facility] [address] [place], [place], [address] Phone: [phone]   Fax:  [phone]  Name: Virginia Pollard MRN: [id number] Date of Birth: Jul 13, 1946

## 2021-11-07 ENCOUNTER — Encounter: Payer: Medicare Other | Admitting: Physical Therapy

## 2021-11-08 ENCOUNTER — Other Ambulatory Visit: Payer: Self-pay

## 2021-11-08 ENCOUNTER — Ambulatory Visit: Payer: Medicare Other | Admitting: Physical Therapy

## 2021-11-08 DIAGNOSIS — M25612 Stiffness of left shoulder, not elsewhere classified: Secondary | ICD-10-CM

## 2021-11-08 DIAGNOSIS — M25512 Pain in left shoulder: Secondary | ICD-10-CM | POA: Diagnosis not present

## 2021-11-08 DIAGNOSIS — R293 Abnormal posture: Secondary | ICD-10-CM

## 2021-11-08 DIAGNOSIS — M6281 Muscle weakness (generalized): Secondary | ICD-10-CM

## 2021-11-13 ENCOUNTER — Other Ambulatory Visit: Payer: Self-pay

## 2021-11-13 ENCOUNTER — Ambulatory Visit: Payer: Medicare Other | Admitting: Physical Therapy

## 2021-11-13 ENCOUNTER — Encounter: Payer: Self-pay | Admitting: Physical Therapy

## 2021-11-13 DIAGNOSIS — M25612 Stiffness of left shoulder, not elsewhere classified: Secondary | ICD-10-CM

## 2021-11-13 DIAGNOSIS — R293 Abnormal posture: Secondary | ICD-10-CM

## 2021-11-13 DIAGNOSIS — M25512 Pain in left shoulder: Secondary | ICD-10-CM | POA: Diagnosis not present

## 2021-11-13 DIAGNOSIS — M6281 Muscle weakness (generalized): Secondary | ICD-10-CM

## 2021-11-13 NOTE — Therapy (Signed)
[facility] [address] [place], [place], [address] Phone: [phone]   Fax:  [phone]  Physical Therapy Treatment  Patient Details  Name: Virginia Pollard MRN: [id number] Date of Birth: 1946-07-28 Referring Provider (PT): Vale Haven MD   Encounter Date: 11/08/2021   PT End of Session - 11/13/21 1135     Visit Number 9    Number of Visits 12    Date for PT Re-Evaluation 11/14/21    Authorization - Visit Number 9    Authorization - Number of Visits 10    Progress Note Due on Visit 10    PT Start Time 1644    PT Stop Time 1728    PT Time Calculation (min) 44 min    Activity Tolerance Patient tolerated treatment well    Behavior During Therapy [facility] for tasks assessed/performed             Past Medical History:  Diagnosis Date   Arthritis    Hyperlipidemia    Hypertension     Past Surgical History:  Procedure Laterality Date   TUBAL LIGATION      There were no vitals filed for this visit.   Subjective Assessment - 11/13/21 1134     Subjective Pt. reports minimal discomfort in L shoulder prior to tx. session.  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    Pertinent History ong history of bilat shoulder pain. 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    Patient Stated Goals Avoid surgery, life without pain and restriction.    Currently in Pain? No/denies    Pain Onset More than a month ago              There.ex:                B UBE 4 min. F/b at consistent cadence.  No increase pain.     Nautilus: 40# seated lat. Pull downs with wand 15x2/ 30# standing tricep extension 15x2/ 40# standing scapular retraction 15x2.               3# seated bicep curls 15x2.     Supine L sh. AROM: ER/ flexion 5x each.    Supine wand: chest press 20x.     Supine serratus punches 10x2 (tactile/verbal cuing for proper technique).   Supine rhythmic stabs L shoulder 3x 30 sec. (Min.-mod. Resistance).           STM to L UT/ deltoid/ proximal biceps at neutral position.         PT Long Term Goals - 10/03/21 1527       PT LONG TERM GOAL #1   Title Pt will improve FOTO score to 61 to display pt improvements in ability to perform ADL's.    Baseline 46    Time 6    Period Weeks    Status New    Target Date 11/14/21      PT LONG TERM GOAL #2   Title Pt will increase L shoulder flexion/ abduction AROM to 90 deg. to promote greater functional mobility with overhead tasks    Baseline 161/44 Shoulder flexion (L shoulder PROM 161)  147/36 Shoulder abduction (L shoulder PROM 89)  90/46 Shoulder external rotation  31/64 Shoulder internal rotation  Time 6    Period Weeks    Status New    Target Date 11/14/21      PT LONG TERM GOAL #3   Title Pt will decrease worse pain to 7/10 to allow greater access to functional ADLs    Baseline Present 4/10, Best 1-2/10, Worst 10/10    Time 6    Period Weeks    Status New    Target Date 11/14/21      PT LONG TERM GOAL #4   Title Pt will increase strength of by at least 1/2 MMT grade in order to demonstrate improvement in strength and function.    Baseline 4-/2+ Shoulder flexion,  4-/2+ Shoulder abduction,  4/4+ Shoulder external rotation,  4/3+ Shoulder internal rotation, 4+/5 Elbow flexion, 4/4+ Elbow extension.    Time 6    Period Weeks    Status New    Target Date 11/14/21      PT LONG TERM GOAL #5   Time 0                   Plan - 11/13/21 1135     Clinical Impression Statement Tx. focused  on L sh./sacpular strengthening to improve functional reaching/ overhead tasks.  Good strengthening in L shoulder ROM below 90 deg. flexion/ abduction.  Limited L shoulder ER AROM but no c/o pain reported.  Pt. will remain compliant with HEP and daily use of L  shoulder with ADL.    Personal Factors and Comorbidities Comorbidity 2;Time since onset of injury/illness/exacerbation;Age;Fitness    Examination-Activity Limitations Carry;Reach Overhead;Lift;Bathing;Sleep    Examination-Participation Restrictions Yard Work;Occupation;Volunteer    Stability/Clinical Decision Making Evolving/Moderate complexity    Clinical Decision Making Moderate    Rehab Potential Fair    PT Frequency 2x / week    PT Duration 6 weeks    PT Treatment/Interventions ADLs/Self Care Home Management;Cryotherapy;Functional mobility training;Therapeutic activities;Therapeutic exercise;Neuromuscular re-education;Patient/family education;Manual techniques;Passive range of motion;Dry needling;Taping;Moist Heat;Electrical Stimulation    PT Next Visit Plan L shoulder AAROM/ strengthening ex.   Issue strengthening ex. program with 3# wt.    PT Home Exercise Plan [email or web address]    Consulted and Agree with Plan of Care Patient             Patient will benefit from skilled therapeutic intervention in order to improve the following deficits and impairments:  Decreased strength, Decreased range of motion, Pain, Impaired flexibility, Decreased activity tolerance, Improper body mechanics, Impaired UE functional use, Decreased coordination, Decreased endurance, Decreased mobility, Decreased safety awareness, Hypomobility, Increased muscle spasms, Impaired perceived functional ability, Postural dysfunction  Visit Diagnosis: Chronic left shoulder pain  Decreased range of motion of left shoulder  Abnormal posture  Muscle weakness (generalized)     Problem List There are no problems to display for this patient.  Cammie Mcgee, PT, DPT # [phone] 11/13/2021, 12:01 PM  [place] [facility] [address] [place], [place], [address] Phone: [phone]   Fax:  [phone]  Name: Virginia Pollard MRN: [id number] Date of Birth: 11/14/1946

## 2021-11-14 ENCOUNTER — Encounter: Payer: Medicare Other | Admitting: Physical Therapy

## 2021-11-14 NOTE — Therapy (Signed)
[facility] [address] [place], [place], [address] Phone: [phone]   Fax:  [phone]  Physical Therapy Treatment Physical Therapy Progress Note   Dates of reporting period  10/03/21 to 11/13/21  Patient Details  Name: Virginia Pollard MRN: [id number] Date of Birth: 1946-05-15 Referring Provider (PT): Vonna Drafts MD   Encounter Date: 11/13/2021   PT End of Session - 11/14/21 1444     Visit Number 10    Number of Visits 12    Date for PT Re-Evaluation 11/14/21    Authorization - Visit Number 10    Authorization - Number of Visits 10    Progress Note Due on Visit 10    PT Start Time 1644    PT Stop Time 1731    PT Time Calculation (min) 47 min    Activity Tolerance Patient tolerated treatment well    Behavior During Therapy [facility] for tasks assessed/performed             Past Medical History:  Diagnosis Date   Arthritis    Hyperlipidemia    Hypertension     Past Surgical History:  Procedure Laterality Date   TUBAL LIGATION      There were no vitals filed for this visit.   Subjective Assessment - 11/14/21 1433     Subjective Pt. reports increase L shoulder discomfort last night.  No subjective pain score given prior to PT tx session.    Pertinent History ong history of bilat shoulder pain. 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    Patient Stated Goals Avoid surgery, life without pain and restriction.    Currently in Pain? No/denies    Pain Onset More than a month ago              There.ex:                B UBE 4 min. F/b at consistent cadence.  No increase pain.     Nautilus: 40# seated lat. Pull downs with wand 15x2/ 30# standing tricep extension 15x2/ 40# standing scapular retraction 15x2.    Standing shoulder  flexion with ball/ added bouncing with PT assist (marked fatigue)              3# seated bicep curls 15x2.     Supine L sh. AROM: ER/ flexion 5x each.   Supine serratus punches 10x2 (tactile/verbal cuing for proper technique).   Supine rhythmic stabs L shoulder 3x 30 sec. (Min.-mod. Resistance).           STM to L UT/ deltoid/ proximal biceps at neutral position.    Standing with cone overhead reaching at shelf (67 inches).    Ice to L shoulder after tx. Session in supine.             PT Long Term Goals - 11/13/21 1653       PT LONG TERM GOAL #1   Title Pt will improve FOTO score to 61 to display pt improvements in ability to perform ADL's.    Baseline 46.   12/27: 50    Time 6    Period Weeks    Status Not Met    Target Date 11/14/21      PT LONG TERM GOAL #2   Title Pt will increase  L shoulder flexion/ abduction AROM to 90 deg. to promote greater functional mobility with overhead tasks    Baseline 161/44 Shoulder flexion (L shoulder PROM 161)  147/36 Shoulder abduction (L shoulder PROM 89)  90/46 Shoulder external rotation  31/64 Shoulder internal rotation    Time 6    Period Weeks    Status Partially Met    Target Date 11/14/21      PT LONG TERM GOAL #3   Title Pt will decrease worse pain to 7/10 to allow greater access to functional ADLs    Baseline Present 4/10, Best 1-2/10, Worst 10/10    Time 6    Period Weeks    Status Partially Met    Target Date 11/14/21      PT LONG TERM GOAL #4   Title Pt will increase strength of by at least 1/2 MMT grade in order to demonstrate improvement in strength and function.    Baseline 4-/2+ Shoulder flexion,  4-/2+ Shoulder abduction,  4/4+ Shoulder external rotation,  4/3+ Shoulder internal rotation, 4+/5 Elbow flexion, 4/4+ Elbow extension.    Time 6    Period Weeks    Status Partially Met    Target Date 11/14/21      PT LONG TERM GOAL #5   Time 0                   Plan - 11/14/21 1445     Clinical  Impression Statement Pt. works hard during tx. session with primary focus on L shoulder ROM/ strengthening ex. Pt. requires AAROM with L shoulder range >90 deg. in sitting/ standing posture.  PT assist with cone lifting from waist to shelf at 67".  Difficulty with L shoulder flexion on wall with added ball bouncing due to muscle fatigue.  PT discussed benefits of heat/ice to assist with pain and inflammation.  Pt. will continue with current HEP.    Personal Factors and Comorbidities Comorbidity 2;Time since onset of injury/illness/exacerbation;Age;Fitness    Examination-Activity Limitations Carry;Reach Overhead;Lift;Bathing;Sleep    Examination-Participation Restrictions Yard Work;Occupation;Volunteer    Stability/Clinical Decision Making Evolving/Moderate complexity    Clinical Decision Making Moderate    Rehab Potential Fair    PT Frequency 2x / week    PT Duration 6 weeks    PT Treatment/Interventions ADLs/Self Care Home Management;Cryotherapy;Functional mobility training;Therapeutic activities;Therapeutic exercise;Neuromuscular re-education;Patient/family education;Manual techniques;Passive range of motion;Dry needling;Taping;Moist Heat;Electrical Stimulation    PT Next Visit Plan L shoulder AAROM/ strengthening ex.   Issue strengthening ex. program with 3# wt.    PT Home Exercise Plan [email or web address]    Consulted and Agree with Plan of Care Patient             Patient will benefit from skilled therapeutic intervention in order to improve the following deficits and impairments:  Decreased strength, Decreased range of motion, Pain, Impaired flexibility, Decreased activity tolerance, Improper body mechanics, Impaired UE functional use, Decreased coordination, Decreased endurance, Decreased mobility, Decreased safety awareness, Hypomobility, Increased muscle spasms, Impaired perceived functional ability, Postural dysfunction  Visit Diagnosis: Chronic left shoulder pain  Decreased range of motion  of left shoulder  Abnormal posture  Muscle weakness (generalized)     Problem List There are no problems to display for this patient.  Pura Spice, PT, DPT # [phone] 11/14/2021, 2:54 PM  [facility] [address] [place], [place], [address] Phone: [phone]   Fax:  [phone]  Name: Virginia Pollard MRN: [id number] Date of Birth:  03/01/1946 ° ° ° °

## 2021-11-15 ENCOUNTER — Ambulatory Visit: Payer: Medicare Other | Admitting: Physical Therapy

## 2021-11-15 ENCOUNTER — Other Ambulatory Visit: Payer: Self-pay

## 2021-11-15 DIAGNOSIS — M25612 Stiffness of left shoulder, not elsewhere classified: Secondary | ICD-10-CM

## 2021-11-15 DIAGNOSIS — M6281 Muscle weakness (generalized): Secondary | ICD-10-CM

## 2021-11-15 DIAGNOSIS — R293 Abnormal posture: Secondary | ICD-10-CM

## 2021-11-15 DIAGNOSIS — M25512 Pain in left shoulder: Secondary | ICD-10-CM | POA: Diagnosis not present

## 2021-11-15 DIAGNOSIS — G8929 Other chronic pain: Secondary | ICD-10-CM

## 2021-11-17 NOTE — Therapy (Addendum)
[place] [facility] [address] [place], [place], [address] Phone: [phone]   Fax:  [phone]  Physical Therapy Treatment  Patient Details  Name: Chiyo Fay MRN: [id number] Date of Birth: [birth year] Referring Provider (PT): Vonna Drafts MD   Encounter Date: 11/15/2021    PT End of Session - 11/17/21 1347     Visit Number 11    Number of Visits 19    Date for PT Re-Evaluation 12/13/21    Authorization - Visit Number 1    Authorization - Number of Visits 10    Progress Note Due on Visit 10    PT Start Time 1644    PT Stop Time 1727    PT Time Calculation (min) 43 min    Activity Tolerance Patient tolerated treatment well    Behavior During Therapy [facility] for tasks assessed/performed             Past Medical History:  Diagnosis Date   Arthritis    Hyperlipidemia    Hypertension     Past Surgical History:  Procedure Laterality Date   TUBAL LIGATION      There were no vitals filed for this visit.    Subjective Assessment - 11/22/21 1256     Subjective Pt. reports persistent L shoulder discomfort over past few days.  Pt. working hard with HEP and daily ADLs.    Pertinent History ong history of bilat shoulder pain. 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    Patient Stated Goals Avoid surgery, life without pain and restriction.    Currently in Pain? Yes    Pain Score 3     Pain Location Shoulder    Pain Orientation Left    Pain Descriptors / Indicators Aching    Pain Onset More than a month ago                [facility] PT Assessment - 11/22/21 0001       Assessment   Medical Diagnosis Tear of Left Rotator Cuff    Referring Provider (PT) Vonna Drafts MD    Onset Date/Surgical Date 11/18/20    Hand Dominance Right    Prior  Therapy Known to PT clinic      [place] residence      Prior Function   Level of Independence Independent                                         PT Long Term Goals - 11/22/21 1309       PT LONG TERM GOAL #1   Title Pt will improve FOTO score to 61 to display pt improvements in ability to perform ADL's.    Baseline 46.   12/27: 50    Time 4    Period Weeks    Status Not Met    Target Date 12/13/21      PT LONG TERM GOAL #2   Title Pt will increase L shoulder flexion/ abduction AROM to 90 deg. to promote greater functional mobility with overhead tasks    Baseline 161/44 Shoulder flexion (L shoulder PROM 161)  147/36 Shoulder abduction (L shoulder PROM 89)  90/46 Shoulder external  rotation  31/64 Shoulder internal rotation    Time 4    Period Weeks    Status Partially Met    Target Date 12/13/21      PT LONG TERM GOAL #3   Title Pt will decrease worse pain to 7/10 to allow greater access to functional ADLs    Baseline Present 4/10, Best 1-2/10, Worst 10/10    Time 6    Period Weeks    Status Partially Met    Target Date 12/13/21      PT LONG TERM GOAL #4   Title Pt will increase strength of by at least 1/2 MMT grade in order to demonstrate improvement in strength and function.    Baseline 4-/2+ Shoulder flexion,  4-/2+ Shoulder abduction,  4/4+ Shoulder external rotation,  4/3+ Shoulder internal rotation, 4+/5 Elbow flexion, 4/4+ Elbow extension.    Time 6    Period Weeks    Status Partially Met    Target Date 12/13/21      PT LONG TERM GOAL #5   Time 0             There.ex:                B UBE 4 min. F/b at consistent cadence.  No increase pain.     Nautilus: 50# seated lat. Pull downs with wand 15x2/ 30# standing tricep extension 15x2/ 40# standing scapular retraction 15x2.   Seated wand ex.: sh. Flexion with PT assist/ chest press with PT assist 20x.  Seated sh. Abduction/ scaption 20x  with PT assist and mirror feedback.                3# standing bicep curls 15x2.     Supine L sh. AROM: ER/ flexion 5x each.   Supine serratus punches 10x2 (tactile/verbal cuing for proper technique).   Supine rhythmic stabs L shoulder 3x 30 sec. (Min.-mod. Resistance).          STM to L UT/ deltoid/ proximal biceps at neutral position.     Standing with cone overhead reaching at shelf (67 inches).      Ice to L shoulder after tx. Session in supine.     Plan - 11/22/21 1305     Clinical Impression Statement L shoulder AROM remains limited to <90 deg. in seated posture.  L shoulder strength remains limited and requires PT instruction/ progression to increase proper sh./scapular mobility.  PT provides minimal manual resistance during isometrics to encouarge proper muscle contraction/ activation. Pt. instructed to continue to use L UE with daily household activities in a pain tolerable range. Pt. will continue to benefit from skilled PT services to increaes L shoulder/ scapular strengthening to improve functional mobility with daily tasks.  See updated goals.    Personal Factors and Comorbidities Comorbidity 2;Time since onset of injury/illness/exacerbation;Age;Fitness    Examination-Activity Limitations Carry;Reach Overhead;Lift;Bathing;Sleep    Examination-Participation Restrictions Yard Work;Occupation;Volunteer    Stability/Clinical Decision Making Evolving/Moderate complexity    Clinical Decision Making Moderate    Rehab Potential Fair    PT Frequency 2x / week    PT Duration 4 weeks    PT Treatment/Interventions ADLs/Self Care Home Management;Cryotherapy;Functional mobility training;Therapeutic activities;Therapeutic exercise;Neuromuscular re-education;Patient/family education;Manual techniques;Passive range of motion;Dry needling;Taping;Moist Heat;Electrical Stimulation    PT Next Visit Plan L shoulder AAROM/ strengthening ex.    PT Home Exercise Plan [email or web address]    Consulted and Agree  with Plan of Care Patient  Patient will benefit from skilled therapeutic intervention in order to improve the following deficits and impairments:  Decreased strength, Decreased range of motion, Pain, Impaired flexibility, Decreased activity tolerance, Improper body mechanics, Impaired UE functional use, Decreased coordination, Decreased endurance, Decreased mobility, Decreased safety awareness, Hypomobility, Increased muscle spasms, Impaired perceived functional ability, Postural dysfunction  Visit Diagnosis: Chronic left shoulder pain  Decreased range of motion of left shoulder  Abnormal posture  Muscle weakness (generalized)     Problem List There are no problems to display for this patient.  Pura Spice, PT, DPT # [phone] 11/22/2021, 1:11 PM  [place] [facility] [address] [place], [place], [address] Phone: [phone]   Fax:  [phone]  Name: Dameka Younker MRN: [id number] Date of Birth: Mar 19, 1946

## 2021-11-20 ENCOUNTER — Other Ambulatory Visit: Payer: Self-pay

## 2021-11-20 ENCOUNTER — Ambulatory Visit: Payer: Medicare Other | Attending: Orthopedic Surgery | Admitting: Physical Therapy

## 2021-11-20 DIAGNOSIS — G8929 Other chronic pain: Secondary | ICD-10-CM | POA: Diagnosis present

## 2021-11-20 DIAGNOSIS — M6281 Muscle weakness (generalized): Secondary | ICD-10-CM | POA: Diagnosis present

## 2021-11-20 DIAGNOSIS — R293 Abnormal posture: Secondary | ICD-10-CM | POA: Diagnosis present

## 2021-11-20 DIAGNOSIS — M25512 Pain in left shoulder: Secondary | ICD-10-CM | POA: Insufficient documentation

## 2021-11-20 DIAGNOSIS — M25612 Stiffness of left shoulder, not elsewhere classified: Secondary | ICD-10-CM | POA: Diagnosis present

## 2021-11-22 ENCOUNTER — Encounter: Payer: Self-pay | Admitting: Physical Therapy

## 2021-11-22 ENCOUNTER — Ambulatory Visit: Payer: Medicare Other | Admitting: Physical Therapy

## 2021-11-22 ENCOUNTER — Other Ambulatory Visit: Payer: Self-pay

## 2021-11-22 DIAGNOSIS — R293 Abnormal posture: Secondary | ICD-10-CM

## 2021-11-22 DIAGNOSIS — M25612 Stiffness of left shoulder, not elsewhere classified: Secondary | ICD-10-CM

## 2021-11-22 DIAGNOSIS — M6281 Muscle weakness (generalized): Secondary | ICD-10-CM

## 2021-11-22 DIAGNOSIS — M25512 Pain in left shoulder: Secondary | ICD-10-CM | POA: Diagnosis not present

## 2021-11-22 DIAGNOSIS — G8929 Other chronic pain: Secondary | ICD-10-CM

## 2021-11-22 NOTE — Addendum Note (Signed)
Addended by: Pura Spice on: 11/22/2021 01:18 PM   Modules accepted: Orders

## 2021-11-25 NOTE — Therapy (Signed)
[place] [facility] [address]. [place], [place], [address] Phone: [phone]   Fax:  [phone]  Physical Therapy Treatment  Patient Details  Name: Virginia Pollard MRN: [id number] Date of Birth: 07/17/1946 Referring Provider (PT): Vonna Drafts MD   Encounter Date: 11/20/2021   PT End of Session - 11/25/21 0727     Visit Number 12    Number of Visits 19    Date for PT Re-Evaluation 12/13/21    Authorization - Visit Number 2    Authorization - Number of Visits 10    Progress Note Due on Visit 10    PT Start Time 1638    PT Stop Time 1731    PT Time Calculation (min) 48 min    Activity Tolerance Patient tolerated treatment well    Behavior During Therapy [facility] for tasks assessed/performed             Past Medical History:  Diagnosis Date   Arthritis    Hyperlipidemia    Hypertension     Past Surgical History:  Procedure Laterality Date   TUBAL LIGATION      There were no vitals filed for this visit.     Pt. reports no new complaints.          There.ex:                B UBE 4 min. F/b at consistent cadence.  No increase pain.     Nautilus: 50# seated lat. Pull downs with wand 15x2/ 30# standing tricep extension 15x2/ 40# standing scapular retraction/ 30# chest press 15x2.   Seated wand ex.: sh. Flexion with PT assist/ chest press with PT assist 20x.  Seated sh. Abduction/ scaption 20x with PT assist and mirror feedback.                4# standing bicep curls 15x2.    Supine yellow ball (bilateral UE)- CW/CCW.  Standing at stair handrail with L UE reaching (yellow ball)   Supine L sh. AROM: ER/ flexion 5x each.   Supine serratus punches 10x2 (tactile/verbal cuing for proper technique).  Grip strength: L 55#/ R 61#.  Supine L shoulder isometric IR/ ER 10x with hold.    Supine rhythmic stabs L shoulder 3x 30 sec. (Min.-mod. Resistance).          STM to L UT/ deltoid/ proximal biceps at neutral position.      Standing with cone overhead reaching at shelf (67 inches)- moderate PT assist.       Ice to L shoulder after tx. Session in supine.        PT Long Term Goals - 11/22/21 1309       PT LONG TERM GOAL #1   Title Pt will improve FOTO score to 61 to display pt improvements in ability to perform ADL's.    Baseline 46.   12/27: 50    Time 4    Period Weeks    Status Not Met    Target Date 12/13/21      PT LONG TERM GOAL #2   Title Pt will increase L shoulder flexion/ abduction AROM to 90 deg. to promote greater functional mobility with overhead tasks    Baseline 161/44 Shoulder flexion (L shoulder PROM 161)  147/36 Shoulder abduction (L shoulder PROM 89)  90/46 Shoulder external rotation  31/64 Shoulder internal rotation    Time 4    Period Weeks    Status Partially Met  Target Date 12/13/21      PT LONG TERM GOAL #3   Title Pt will decrease worse pain to 7/10 to allow greater access to functional ADLs    Baseline Present 4/10, Best 1-2/10, Worst 10/10    Time 6    Period Weeks    Status Partially Met    Target Date 12/13/21      PT LONG TERM GOAL #4   Title Pt will increase strength of by at least 1/2 MMT grade in order to demonstrate improvement in strength and function.    Baseline 4-/2+ Shoulder flexion,  4-/2+ Shoulder abduction,  4/4+ Shoulder external rotation,  4/3+ Shoulder internal rotation, 4+/5 Elbow flexion, 4/4+ Elbow extension.    Time 6    Period Weeks    Status Partially Met    Target Date 12/13/21      PT LONG TERM GOAL #5   Time 0                   Plan - 11/25/21 0727     Clinical Impression Statement Modeerate L shoulder assist required during sh. flexion/ abduction and 2nd shelf tasks.  Pt. works hard with resisted ther.ex. at Nautilus and sh./ scapular strengthening ex in supine/ sidelying position.  Less L UT compensation noted during ther.ex. and no c/o muscle fatigtue t/o tx. session.  Pt. will continue to focus on sh. strengthening  with HEP and use L shoulder with daily tasks.    Personal Factors and Comorbidities Comorbidity 2;Time since onset of injury/illness/exacerbation;Age;Fitness    Examination-Activity Limitations Carry;Reach Overhead;Lift;Bathing;Sleep    Examination-Participation Restrictions Yard Work;Occupation;Volunteer    Stability/Clinical Decision Making Evolving/Moderate complexity    Clinical Decision Making Moderate    Rehab Potential Fair    PT Frequency 2x / week    PT Duration 4 weeks    PT Treatment/Interventions ADLs/Self Care Home Management;Cryotherapy;Functional mobility training;Therapeutic activities;Therapeutic exercise;Neuromuscular re-education;Patient/family education;Manual techniques;Passive range of motion;Dry needling;Taping;Moist Heat;Electrical Stimulation    PT Next Visit Plan L shoulder AAROM/ strengthening ex.    PT Home Exercise Plan [email or web address]    Consulted and Agree with Plan of Care Patient             Patient will benefit from skilled therapeutic intervention in order to improve the following deficits and impairments:  Decreased strength, Decreased range of motion, Pain, Impaired flexibility, Decreased activity tolerance, Improper body mechanics, Impaired UE functional use, Decreased coordination, Decreased endurance, Decreased mobility, Decreased safety awareness, Hypomobility, Increased muscle spasms, Impaired perceived functional ability, Postural dysfunction  Visit Diagnosis: Chronic left shoulder pain  Decreased range of motion of left shoulder  Abnormal posture  Muscle weakness (generalized)     Problem List There are no problems to display for this patient.   Pura Spice, PT 11/25/2021, 7:39 AM  [place] [facility] [address] [place], [place], [address] Phone: [phone]   Fax:  [phone]  Name: Marium Ragan MRN: [id number] Date of Birth: 1946/04/16

## 2021-11-25 NOTE — Therapy (Signed)
[place] [facility] [address] [place], [place], [address] Phone: [phone]   Fax:  [phone]  Physical Therapy Treatment  Patient Details  Name: Virginia Pollard MRN: [id number] Date of Birth: 1946/01/19 Referring Provider (PT): Vonna Drafts MD   Encounter Date: 11/22/2021   PT End of Session - 11/25/21 1817     Visit Number 13    Number of Visits 19    Date for PT Re-Evaluation 12/13/21    Authorization - Visit Number 3    Authorization - Number of Visits 10    Progress Note Due on Visit 10    PT Start Time 1644    PT Stop Time 1731    PT Time Calculation (min) 47 min    Activity Tolerance Patient tolerated treatment well    Behavior During Therapy [facility] for tasks assessed/performed             Past Medical History:  Diagnosis Date   Arthritis    Hyperlipidemia    Hypertension     Past Surgical History:  Procedure Laterality Date   TUBAL LIGATION      There were no vitals filed for this visit.   Subjective Assessment - 11/25/21 1814     Subjective Pt. discussed her limitaitons with daily tasks/ lifting.  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    Pertinent History ong history of bilat shoulder pain. 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    Patient Stated Goals Avoid surgery, life without pain and restriction.    Currently in Pain? Yes    Pain Score 3     Pain Location Shoulder    Pain Orientation Left    Pain Descriptors / Indicators Aching    Pain Onset More than a month ago               There.ex:   Seated/standing B shoulder flexion/ abduction/ ER with back against wall wand ex. (PT assist with L shoulder)- verbal and tactile cuing as  needed.   Nautilus: 50# seated lat. Pull downs with wand 15x2/ 30# standing tricep extension 15x2/ 40# standing scapular retraction 15x2.   Supine L shoulder: Flexion with PT assist/ chest press with PT assist 20x.  Seated sh. Abduction/ scaption 20x with PT assist and mirror feedback.    4# supine bicep curls/ serratus punches (with PT assist) 15x2.     Supine L sh. AROM: ER/ flexion 5x each.      Supine rhythmic stabs L shoulder 3x 30 sec. (Min.-mod. Resistance).          STM to L UT/ deltoid/ proximal biceps at neutral position.     B UBE 4 min. F/b at consistent cadence.  No increase pain.          PT Long Term Goals - 11/22/21 1309       PT LONG TERM GOAL #1   Title Pt will improve FOTO score to 61 to display pt improvements in ability to perform ADL's.    Baseline 46.   12/27: 50    Time 4    Period Weeks    Status Not Met    Target Date 12/13/21      PT LONG  TERM GOAL #2   Title Pt will increase L shoulder flexion/ abduction AROM to 90 deg. to promote greater functional mobility with overhead tasks    Baseline 161/44 Shoulder flexion (L shoulder PROM 161)  147/36 Shoulder abduction (L shoulder PROM 89)  90/46 Shoulder external rotation  31/64 Shoulder internal rotation    Time 4    Period Weeks    Status Partially Met    Target Date 12/13/21      PT LONG TERM GOAL #3   Title Pt will decrease worse pain to 7/10 to allow greater access to functional ADLs    Baseline Present 4/10, Best 1-2/10, Worst 10/10    Time 6    Period Weeks    Status Partially Met    Target Date 12/13/21      PT LONG TERM GOAL #4   Title Pt will increase strength of by at least 1/2 MMT grade in order to demonstrate improvement in strength and function.    Baseline 4-/2+ Shoulder flexion,  4-/2+ Shoulder abduction,  4/4+ Shoulder external rotation,  4/3+ Shoulder internal rotation, 4+/5 Elbow flexion, 4/4+ Elbow extension.    Time 6    Period Weeks    Status Partially Met    Target Date  12/13/21      PT LONG TERM GOAL #5   Time 0                   Plan - 11/25/21 1817     Clinical Impression Statement Tx. focused on L sh./sacpular strengthening to improve functional reaching/ overhead tasks. Good strengthening in L shoulder ROM below 90 deg. flexion/ abduction. Limited L shoulder ER AROM but no c/o pain reported.  Increase L shoulder AROM with back against wall and cuing to avoid compensatory movement patterns.  Pt. will remain compliant with HEP and daily use of L shoulder with ADL.    Personal Factors and Comorbidities Comorbidity 2;Time since onset of injury/illness/exacerbation;Age;Fitness    Examination-Activity Limitations Carry;Reach Overhead;Lift;Bathing;Sleep    Examination-Participation Restrictions Yard Work;Occupation;Volunteer    Stability/Clinical Decision Making Evolving/Moderate complexity    Clinical Decision Making Moderate    Rehab Potential Fair    PT Frequency 2x / week    PT Duration 4 weeks    PT Treatment/Interventions ADLs/Self Care Home Management;Cryotherapy;Functional mobility training;Therapeutic activities;Therapeutic exercise;Neuromuscular re-education;Patient/family education;Manual techniques;Passive range of motion;Dry needling;Taping;Moist Heat;Electrical Stimulation    PT Next Visit Plan L shoulder AAROM/ strengthening ex.    PT Home Exercise Plan [email or web address]    Consulted and Agree with Plan of Care Patient             Patient will benefit from skilled therapeutic intervention in order to improve the following deficits and impairments:  Decreased strength, Decreased range of motion, Pain, Impaired flexibility, Decreased activity tolerance, Improper body mechanics, Impaired UE functional use, Decreased coordination, Decreased endurance, Decreased mobility, Decreased safety awareness, Hypomobility, Increased muscle spasms, Impaired perceived functional ability, Postural dysfunction  Visit Diagnosis: Chronic left shoulder  pain  Decreased range of motion of left shoulder  Abnormal posture  Muscle weakness (generalized)     Problem List There are no problems to display for this patient.  Pura Spice, PT, DPT # [phone] 11/25/2021, 6:26 PM  [place] [facility] [address] [place], [place], [address] Phone: [phone]   Fax:  [phone]  Name: Virginia Pollard MRN: [id number] Date of Birth: 03-18-1946

## 2021-11-27 ENCOUNTER — Other Ambulatory Visit: Payer: Self-pay

## 2021-11-27 ENCOUNTER — Encounter: Payer: Self-pay | Admitting: Physical Therapy

## 2021-11-27 ENCOUNTER — Ambulatory Visit: Payer: Medicare Other | Admitting: Physical Therapy

## 2021-11-27 DIAGNOSIS — R293 Abnormal posture: Secondary | ICD-10-CM

## 2021-11-27 DIAGNOSIS — M25612 Stiffness of left shoulder, not elsewhere classified: Secondary | ICD-10-CM

## 2021-11-27 DIAGNOSIS — M25512 Pain in left shoulder: Secondary | ICD-10-CM | POA: Diagnosis not present

## 2021-11-27 DIAGNOSIS — M6281 Muscle weakness (generalized): Secondary | ICD-10-CM

## 2021-11-27 DIAGNOSIS — G8929 Other chronic pain: Secondary | ICD-10-CM

## 2021-11-27 NOTE — Therapy (Signed)
[facility] [address] [place], [place], [address] Phone: [phone]   Fax:  [phone]  Physical Therapy Treatment  Patient Details  Name: Virginia Pollard MRN: [id number] Date of Birth: 02-Feb-1946 Referring Provider (PT): Vonna Drafts MD   Encounter Date: 11/27/2021   PT End of Session - 11/30/21 1535     Visit Number 14    Number of Visits 19    Date for PT Re-Evaluation 12/13/21    Authorization - Visit Number 4    Authorization - Number of Visits 10    Progress Note Due on Visit 10    PT Start Time 2542    PT Stop Time 1728    PT Time Calculation (min) 45 min    Activity Tolerance Patient tolerated treatment well    Behavior During Therapy [facility] for tasks assessed/performed             Past Medical History:  Diagnosis Date   Arthritis    Hyperlipidemia    Hypertension     Past Surgical History:  Procedure Laterality Date   TUBAL LIGATION      There were no vitals filed for this visit.     Pt. reports doing well. No new issues. No pain reported.         There.ex:    B UBE 4 min. F/b at consistent cadence.  No increase pain.    Standing L shoulder flexion at wall ladder 10x.  Nautilus: 50# seated lat. Pull downs with wand 15x2/ 30# standing tricep extension 15x2/ 40# standing scapular retraction 15x2.  Standing shoulder flexion with ball/ supine shoulder flexion with ball 15x each.    4# supine bicep curls/ serratus punches (with PT assist) 15x2.  Supine wt. Wand press-up 15x2.    Supine L sh. AROM: ER/ flexion 5x each.      Supine rhythmic stabs L shoulder 3x 30 sec. (Min.-mod. Resistance).          STM to L UT/ deltoid/ proximal biceps at neutral position.            PT Long Term Goals - 11/22/21 1309       PT LONG TERM GOAL #1   Title Pt will improve FOTO score to 61 to display pt improvements in ability to perform ADL's.    Baseline 46.   12/27: 50    Time 4    Period  Weeks    Status Not Met    Target Date 12/13/21      PT LONG TERM GOAL #2   Title Pt will increase L shoulder flexion/ abduction AROM to 90 deg. to promote greater functional mobility with overhead tasks    Baseline 161/44 Shoulder flexion (L shoulder PROM 161)  147/36 Shoulder abduction (L shoulder PROM 89)  90/46 Shoulder external rotation  31/64 Shoulder internal rotation    Time 4    Period Weeks    Status Partially Met    Target Date 12/13/21      PT LONG TERM GOAL #3   Title Pt will decrease worse pain to 7/10 to allow greater access to functional ADLs    Baseline Present 4/10, Best 1-2/10, Worst 10/10    Time 6    Period Weeks    Status Partially Met    Target Date 12/13/21      PT LONG TERM GOAL #4   Title Pt will increase strength of by at least 1/2 MMT grade in order  to demonstrate improvement in strength and function.    Baseline 4-/2+ Shoulder flexion,  4-/2+ Shoulder abduction,  4/4+ Shoulder external rotation,  4/3+ Shoulder internal rotation, 4+/5 Elbow flexion, 4/4+ Elbow extension.    Time 6    Period Weeks    Status Partially Met    Target Date 12/13/21      PT LONG TERM GOAL #5   Time 0                   Plan - 11/30/21 1536     Clinical Impression Statement Pt. shows good motivation/ effort to progress L shoulder/ scapular strengthening.  Pt. completes sets/reps with good technique and minimal cuing from PT.  Limited L shoulder ER AROM but no c/o pain reported.  Pt. will continue to work hard with daily HEP/ functional activity and benefit from skilled PT services to improve L sh. ROM.    Personal Factors and Comorbidities Comorbidity 2;Time since onset of injury/illness/exacerbation;Age;Fitness    Examination-Activity Limitations Carry;Reach Overhead;Lift;Bathing;Sleep    Examination-Participation Restrictions Yard Work;Occupation;Volunteer    Stability/Clinical Decision Making Evolving/Moderate complexity    Clinical Decision Making Moderate     Rehab Potential Fair    PT Frequency 2x / week    PT Duration 4 weeks    PT Treatment/Interventions ADLs/Self Care Home Management;Cryotherapy;Functional mobility training;Therapeutic activities;Therapeutic exercise;Neuromuscular re-education;Patient/family education;Manual techniques;Passive range of motion;Dry needling;Taping;Moist Heat;Electrical Stimulation    PT Next Visit Plan L shoulder AAROM/ strengthening ex.    PT Home Exercise Plan [email or web address]    Consulted and Agree with Plan of Care Patient             Patient will benefit from skilled therapeutic intervention in order to improve the following deficits and impairments:  Decreased strength, Decreased range of motion, Pain, Impaired flexibility, Decreased activity tolerance, Improper body mechanics, Impaired UE functional use, Decreased coordination, Decreased endurance, Decreased mobility, Decreased safety awareness, Hypomobility, Increased muscle spasms, Impaired perceived functional ability, Postural dysfunction  Visit Diagnosis: Chronic left shoulder pain  Decreased range of motion of left shoulder  Abnormal posture  Muscle weakness (generalized)     Problem List There are no problems to display for this patient.  Pura Spice, PT, DPT # [phone] 11/30/2021, 3:45 PM   [facility] [address] [place], [place], [address] Phone: [phone]   Fax:  [phone]  Name: Virginia Pollard MRN: [id number] Date of Birth: 23-Dec-1945

## 2021-11-29 ENCOUNTER — Ambulatory Visit: Payer: Medicare Other | Admitting: Physical Therapy

## 2021-11-29 ENCOUNTER — Other Ambulatory Visit: Payer: Self-pay

## 2021-11-29 DIAGNOSIS — M25612 Stiffness of left shoulder, not elsewhere classified: Secondary | ICD-10-CM

## 2021-11-29 DIAGNOSIS — M25512 Pain in left shoulder: Secondary | ICD-10-CM

## 2021-11-29 DIAGNOSIS — M6281 Muscle weakness (generalized): Secondary | ICD-10-CM

## 2021-11-29 DIAGNOSIS — G8929 Other chronic pain: Secondary | ICD-10-CM

## 2021-11-29 DIAGNOSIS — R293 Abnormal posture: Secondary | ICD-10-CM

## 2021-12-01 NOTE — Therapy (Signed)
[facility] [address] [place], [place], [address] Phone: [phone]   Fax:  [phone]  Physical Therapy Treatment  Patient Details  Name: Virginia Pollard MRN: [id number] Date of Birth: 05-Nov-1946 Referring Provider (PT): Vonna Drafts MD   Encounter Date: 11/29/2021   PT End of Session - 12/01/21 0802     Visit Number 15    Number of Visits 19    Date for PT Re-Evaluation 12/13/21    Authorization - Visit Number 5    Authorization - Number of Visits 10    Progress Note Due on Visit 10    PT Start Time 1640    PT Stop Time 3419    PT Time Calculation (min) 47 min    Activity Tolerance Patient tolerated treatment well    Behavior During Therapy [facility] for tasks assessed/performed             Past Medical History:  Diagnosis Date   Arthritis    Hyperlipidemia    Hypertension     Past Surgical History:  Procedure Laterality Date   TUBAL LIGATION      There were no vitals filed for this visit.   Subjective Assessment - 12/01/21 0758     Subjective Pt. continues to report that she has done well this week.  No c/o L shoulder pain prior to tx. session.    Pertinent History ong history of bilat shoulder pain. 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    Patient Stated Goals Avoid surgery, life without pain and restriction.    Currently in Pain? No/denies    Pain Onset More than a month ago              There.ex:    B UBE 4 min. F/b at consistent cadence.  No increase pain.    Standing wall ladder 10x (shoulder flexion focus)   Nautilus: 50# seated lat. Pull downs with wand 15x2/ 40# standing tricep extension 15x2/ 40# shoulder extension with wand 15x2/ 40# standing scapular retraction 15x2.   Seated L shoulder IR/ER with YTB  (PT assist in available range) 15x2   4# supine bicep curls/ serratus punches (with PT assist) 15x2.   Seated wand chest press with PT assist 15x2.     Supine L sh. AROM: ER/ flexion 5x each.      Supine rhythmic stabs L shoulder 3x 30 sec. (Min.-mod. Resistance).          Grip strength: L 56#/ R 59#  STM to L UT/ deltoid/ proximal biceps at neutral position.         PT Long Term Goals - 11/22/21 1309       PT LONG TERM GOAL #1   Title Pt will improve FOTO score to 61 to display pt improvements in ability to perform ADL's.    Baseline 46.   12/27: 50    Time 4    Period Weeks    Status Not Met    Target Date 12/13/21      PT LONG TERM GOAL #2   Title Pt will increase L shoulder flexion/ abduction AROM to 90 deg. to promote greater functional mobility with overhead tasks    Baseline 161/44 Shoulder flexion (L shoulder PROM 161)  147/36 Shoulder abduction (L shoulder PROM 89)  90/46  Shoulder external rotation  31/64 Shoulder internal rotation    Time 4    Period Weeks    Status Partially Met    Target Date 12/13/21      PT LONG TERM GOAL #3   Title Pt will decrease worse pain to 7/10 to allow greater access to functional ADLs    Baseline Present 4/10, Best 1-2/10, Worst 10/10    Time 6    Period Weeks    Status Partially Met    Target Date 12/13/21      PT LONG TERM GOAL #4   Title Pt will increase strength of by at least 1/2 MMT grade in order to demonstrate improvement in strength and function.    Baseline 4-/2+ Shoulder flexion,  4-/2+ Shoulder abduction,  4/4+ Shoulder external rotation,  4/3+ Shoulder internal rotation, 4+/5 Elbow flexion, 4/4+ Elbow extension.    Time 6    Period Weeks    Status Partially Met    Target Date 12/13/21      PT LONG TERM GOAL #5   Time 0                   Plan - 12/01/21 0803     Clinical Impression Statement Pt. works hard during tx. session and progressing with increase UE resisted ex.  Moderate L UE/ shoulder  muscle fatigue after tx. session.  Pt. completed tx. with use of ice to manage inflammation/ discomfort. Limited L shoulder ER AROM but no c/o pain.  L UT trigger point noted during STM/ palpation.  Pt. will continue to work hard with daily HEP/ functional activities and benefit from skilled PT services to improve L shoulder ROM.    Personal Factors and Comorbidities Comorbidity 2;Time since onset of injury/illness/exacerbation;Age;Fitness    Examination-Activity Limitations Carry;Reach Overhead;Lift;Bathing;Sleep    Examination-Participation Restrictions Yard Work;Occupation;Volunteer    Stability/Clinical Decision Making Evolving/Moderate complexity    Clinical Decision Making Moderate    Rehab Potential Fair    PT Frequency 2x / week    PT Duration 4 weeks    PT Treatment/Interventions ADLs/Self Care Home Management;Cryotherapy;Functional mobility training;Therapeutic activities;Therapeutic exercise;Neuromuscular re-education;Patient/family education;Manual techniques;Passive range of motion;Dry needling;Taping;Moist Heat;Electrical Stimulation    PT Next Visit Plan L shoulder AAROM/ strengthening ex.    PT Home Exercise Plan [email or web address]    Consulted and Agree with Plan of Care Patient             Patient will benefit from skilled therapeutic intervention in order to improve the following deficits and impairments:  Decreased strength, Decreased range of motion, Pain, Impaired flexibility, Decreased activity tolerance, Improper body mechanics, Impaired UE functional use, Decreased coordination, Decreased endurance, Decreased mobility, Decreased safety awareness, Hypomobility, Increased muscle spasms, Impaired perceived functional ability, Postural dysfunction  Visit Diagnosis: Chronic left shoulder pain  Decreased range of motion of left shoulder  Abnormal posture  Muscle weakness (generalized)     Problem List There are no problems to display for this patient.  Pura Spice,  PT, DPT # [phone] 12/01/2021, 8:08 AM  [facility] [address] [place], [place], [address] Phone: [phone]   Fax:  [phone]  Name: Brylin Stopper MRN: [id number] Date of Birth: [birth year]

## 2021-12-04 ENCOUNTER — Other Ambulatory Visit: Payer: Self-pay

## 2021-12-04 ENCOUNTER — Ambulatory Visit: Payer: Medicare Other | Admitting: Physical Therapy

## 2021-12-04 DIAGNOSIS — R293 Abnormal posture: Secondary | ICD-10-CM

## 2021-12-04 DIAGNOSIS — M25512 Pain in left shoulder: Secondary | ICD-10-CM

## 2021-12-04 DIAGNOSIS — M25612 Stiffness of left shoulder, not elsewhere classified: Secondary | ICD-10-CM

## 2021-12-04 DIAGNOSIS — G8929 Other chronic pain: Secondary | ICD-10-CM

## 2021-12-04 DIAGNOSIS — M6281 Muscle weakness (generalized): Secondary | ICD-10-CM

## 2021-12-04 NOTE — Therapy (Signed)
[facility] [address] [place], [place], [address] Phone: [phone]   Fax:  [phone]  Physical Therapy Treatment  Patient Details  Name: Virginia Pollard MRN: [id number] Date of Birth: February 18, 1946 Referring Provider (PT): Vonna Drafts MD   Encounter Date: 12/04/2021   PT End of Session - 12/05/21 0953     Visit Number 16    Number of Visits 19    Date for PT Re-Evaluation 12/13/21    Authorization - Visit Number 6    Authorization - Number of Visits 10    Progress Note Due on Visit 10    PT Start Time 1644    PT Stop Time 1732    PT Time Calculation (min) 48 min    Activity Tolerance Patient tolerated treatment well    Behavior During Therapy [facility] for tasks assessed/performed             Past Medical History:  Diagnosis Date   Arthritis    Hyperlipidemia    Hypertension     Past Surgical History:  Procedure Laterality Date   TUBAL LIGATION      There were no vitals filed for this visit.   Subjective Assessment - 12/05/21 0952     Subjective Pt. reports no new complaints.  No L shoulder pain at rest.    Pertinent History ong history of bilat shoulder pain. 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    Patient Stated Goals Avoid surgery, life without pain and restriction.    Currently in Pain? No/denies    Pain Onset More than a month ago                There.ex:    B UBE 4 min. F/b at consistent cadence.  No increase pain.     Nautilus: 50# seated lat. Pull downs with wand 15x2/ 40# standing tricep extension 15x2/ 20# chest press/ 40# shoulder extension with wand 15x2/ 40# standing scapular retraction 15x2.  Seated L shoulder IR/ER with YTB (PT assist in available range) 15x2   4# seated bicep curls (with PT assist)  15x2.    Standing waist to eye level/ overhead lifting with yellow box (added 2.5# wt.)- PT assist.     Standing wall ladder 10x (flexion/ abduction).  STM to L UT/ deltoid/ proximal biceps at neutral position.        PT Long Term Goals - 11/22/21 1309       PT LONG TERM GOAL #1   Title Pt will improve FOTO score to 61 to display pt improvements in ability to perform ADL's.    Baseline 46.   12/27: 50    Time 4    Period Weeks    Status Not Met    Target Date 12/13/21      PT LONG TERM GOAL #2   Title Pt will increase L shoulder flexion/ abduction AROM to 90 deg. to promote greater functional mobility with overhead tasks    Baseline 161/44 Shoulder flexion (L shoulder PROM 161)  147/36 Shoulder abduction (L shoulder PROM 89)  90/46 Shoulder external rotation  31/64 Shoulder internal rotation    Time 4    Period Weeks    Status Partially Met    Target Date 12/13/21      PT LONG TERM GOAL #3  Title Pt will decrease worse pain to 7/10 to allow greater access to functional ADLs    Baseline Present 4/10, Best 1-2/10, Worst 10/10    Time 6    Period Weeks    Status Partially Met    Target Date 12/13/21      PT LONG TERM GOAL #4   Title Pt will increase strength of by at least 1/2 MMT grade in order to demonstrate improvement in strength and function.    Baseline 4-/2+ Shoulder flexion,  4-/2+ Shoulder abduction,  4/4+ Shoulder external rotation,  4/3+ Shoulder internal rotation, 4+/5 Elbow flexion, 4/4+ Elbow extension.    Time 6    Period Weeks    Status Partially Met    Target Date 12/13/21      PT LONG TERM GOAL #5   Time 0                   Plan - 12/05/21 0954     Clinical Impression Statement Tx. remains focused on L shoulder strengthening and assist with overhead functional movement/ lifting tasks.  L shoulder crepitus/ popping noted with shoulder flexion >90 deg. and eccentric shoulder control.  Pt. requires light PT assist with waist to overhead box  lifting.  Pt. remains highly motivated and compliant with current progress L shoulder ex. program.  Pt. will ice L shoulder at home after tx. to manage inflammation/ pain.    Personal Factors and Comorbidities Comorbidity 2;Time since onset of injury/illness/exacerbation;Age;Fitness    Examination-Activity Limitations Carry;Reach Overhead;Lift;Bathing;Sleep    Examination-Participation Restrictions Yard Work;Occupation;Volunteer    Stability/Clinical Decision Making Evolving/Moderate complexity    Clinical Decision Making Moderate    Rehab Potential Fair    PT Frequency 2x / week    PT Duration 4 weeks    PT Treatment/Interventions ADLs/Self Care Home Management;Cryotherapy;Functional mobility training;Therapeutic activities;Therapeutic exercise;Neuromuscular re-education;Patient/family education;Manual techniques;Passive range of motion;Dry needling;Taping;Moist Heat;Electrical Stimulation    PT Next Visit Plan L shoulder AAROM/ strengthening ex.    PT Home Exercise Plan [email or web address]    Consulted and Agree with Plan of Care Patient             Patient will benefit from skilled therapeutic intervention in order to improve the following deficits and impairments:  Decreased strength, Decreased range of motion, Pain, Impaired flexibility, Decreased activity tolerance, Improper body mechanics, Impaired UE functional use, Decreased coordination, Decreased endurance, Decreased mobility, Decreased safety awareness, Hypomobility, Increased muscle spasms, Impaired perceived functional ability, Postural dysfunction  Visit Diagnosis: Chronic left shoulder pain  Decreased range of motion of left shoulder  Abnormal posture  Muscle weakness (generalized)     Problem List There are no problems to display for this patient.  Pura Spice, PT, DPT # [phone] 12/05/2021, 10:30 AM  [facility] [address] [place], [place], [address] Phone: [phone]    Fax:  [phone]  Name: Virginia Pollard MRN: [id number] Date of Birth: [birth year]

## 2021-12-06 ENCOUNTER — Other Ambulatory Visit: Payer: Self-pay

## 2021-12-06 ENCOUNTER — Ambulatory Visit: Payer: Medicare Other | Admitting: Physical Therapy

## 2021-12-06 DIAGNOSIS — G8929 Other chronic pain: Secondary | ICD-10-CM

## 2021-12-06 DIAGNOSIS — M25612 Stiffness of left shoulder, not elsewhere classified: Secondary | ICD-10-CM

## 2021-12-06 DIAGNOSIS — M25512 Pain in left shoulder: Secondary | ICD-10-CM | POA: Diagnosis not present

## 2021-12-06 DIAGNOSIS — R293 Abnormal posture: Secondary | ICD-10-CM

## 2021-12-06 DIAGNOSIS — M6281 Muscle weakness (generalized): Secondary | ICD-10-CM

## 2021-12-14 NOTE — Therapy (Signed)
[facility] [address] [place], [place], [address] Phone: [phone]   Fax:  [phone]  Physical Therapy Treatment  Patient Details  Name: Virginia Pollard MRN: [id number] Date of Birth: Jul 01, 1946 Referring Provider (PT): Vonna Drafts MD   Encounter Date: 12/06/2021   PT End of Session - 12/14/21 1157     Visit Number 17    Number of Visits 19    Date for PT Re-Evaluation 12/13/21    Authorization - Visit Number 7    Authorization - Number of Visits 10    Progress Note Due on Visit 10    PT Start Time 9233    PT Stop Time 1810    PT Time Calculation (min) 45 min    Activity Tolerance Patient tolerated treatment well    Behavior During Therapy [facility] for tasks assessed/performed             Past Medical History:  Diagnosis Date   Arthritis    Hyperlipidemia    Hypertension     Past Surgical History:  Procedure Laterality Date   TUBAL LIGATION      There were no vitals filed for this visit.   Subjective Assessment - 12/14/21 1155     Subjective Pt. reports no L shoulder pain but remains limited with overhead reaching.  Pt. states she feel comfortable with discharge from PT today.    Pertinent History ong history of bilat shoulder pain. 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    Patient Stated Goals Avoid surgery, life without pain and restriction.    Currently in Pain? No/denies    Pain Onset More than a month ago              There.ex:    B UBE 4 min. F/b at consistent cadence.  No increase pain.     Standing wall ladder 10x (flexion/ abduction).  Nautilus: 50# seated lat. Pull downs with wand 15x2/ 40# standing tricep extension 15x2/ 20# chest press/ 40# shoulder extension with wand 15x2/ 40# standing scapular  retraction 15x2.   Seated L shoulder IR/ER with YTB (PT assist in available range) 15x2   4# seated bicep curls (with PT assist) 15x2.      STM to L UT/ deltoid/ proximal biceps at neutral position.     Discussed discharge/ HEP.        PT Long Term Goals - 12/14/21 1201       PT LONG TERM GOAL #1   Title Pt will improve FOTO score to 61 to display pt improvements in ability to perform ADL's.    Baseline 46.   12/27: 50    Time 4    Period Weeks    Status Not Met    Target Date 12/06/21      PT LONG TERM GOAL #2   Title Pt will increase L shoulder flexion/ abduction AROM to 90 deg. to promote greater functional mobility with overhead tasks    Baseline 161/44 Shoulder flexion (L shoulder PROM 161)  147/36 Shoulder abduction (L shoulder PROM 89)  90/46 Shoulder external rotation  31/64 Shoulder internal rotation    Time 4    Period Weeks    Status Partially Met    Target Date 12/06/21      PT LONG TERM GOAL #3  Title Pt will decrease worse pain to 7/10 to allow greater access to functional ADLs    Baseline Present 4/10, Best 1-2/10, Worst 10/10.  1/19: pt. has reported no pain at rest and will reaching tasks.  Limited with overhead reaching/ shoulder flexion.    Time 6    Period Weeks    Status Partially Met    Target Date 12/06/21      PT LONG TERM GOAL #4   Title Pt will increase strength of by at least 1/2 MMT grade in order to demonstrate improvement in strength and function.    Baseline 4-/2+ Shoulder flexion,  4-/2+ Shoulder abduction,  4/4+ Shoulder external rotation,  4/3+ Shoulder internal rotation, 4+/5 Elbow flexion, 4/4+ Elbow extension.  1/19: see flowsheet    Time 6    Period Weeks    Status Partially Met    Target Date 12/06/21      PT LONG TERM GOAL #5   Time 0                   Plan - 12/14/21 1158     Clinical Impression Statement Pt. has worked hard and shown marked improvement in L shoulder strength/ carrying ability.  Pts. L shoulder  overhead remains limited by full-thickness RTC tear.  Pt. pain has been under control for the past several weeks.  Pt. understands current HEP and instructed to contact PT if any questions or regression in symptoms.  Discharge from PT at this time.    Personal Factors and Comorbidities Comorbidity 2;Time since onset of injury/illness/exacerbation;Age;Fitness    Examination-Activity Limitations Carry;Reach Overhead;Lift;Bathing;Sleep    Examination-Participation Restrictions Yard Work;Occupation;Volunteer    Stability/Clinical Decision Making Evolving/Moderate complexity    Clinical Decision Making Moderate    Rehab Potential Fair    PT Frequency 2x / week    PT Duration 4 weeks    PT Treatment/Interventions ADLs/Self Care Home Management;Cryotherapy;Functional mobility training;Therapeutic activities;Therapeutic exercise;Neuromuscular re-education;Patient/family education;Manual techniques;Passive range of motion;Dry needling;Taping;Moist Heat;Electrical Stimulation    PT Next Visit Plan Discharge visit    PT Home Exercise Plan [email or web address]    Consulted and Agree with Plan of Care Patient             Patient will benefit from skilled therapeutic intervention in order to improve the following deficits and impairments:  Decreased strength, Decreased range of motion, Pain, Impaired flexibility, Decreased activity tolerance, Improper body mechanics, Impaired UE functional use, Decreased coordination, Decreased endurance, Decreased mobility, Decreased safety awareness, Hypomobility, Increased muscle spasms, Impaired perceived functional ability, Postural dysfunction  Visit Diagnosis: Chronic left shoulder pain  Decreased range of motion of left shoulder  Abnormal posture  Muscle weakness (generalized)     Problem List There are no problems to display for this patient.  Pura Spice, PT, DPT # [phone] 12/14/2021, 12:05 PM  [facility] [address] [place], [place], [address] Phone: [phone]   Fax:  [phone]  Name: Jensyn Shave MRN: [id number] Date of Birth: Sep 17, 1946

## 2021-12-18 IMAGING — CR DG SHOULDER 2+V*L*
3 series · 3 of 3 positions shown · non-contrast
Comparison: None.

CLINICAL DATA: 74-year-old female with fall and trauma to the left
shoulder.

EXAM:
LEFT SHOULDER - 2+ VIEW

[shoulder y view (1 of 2)]
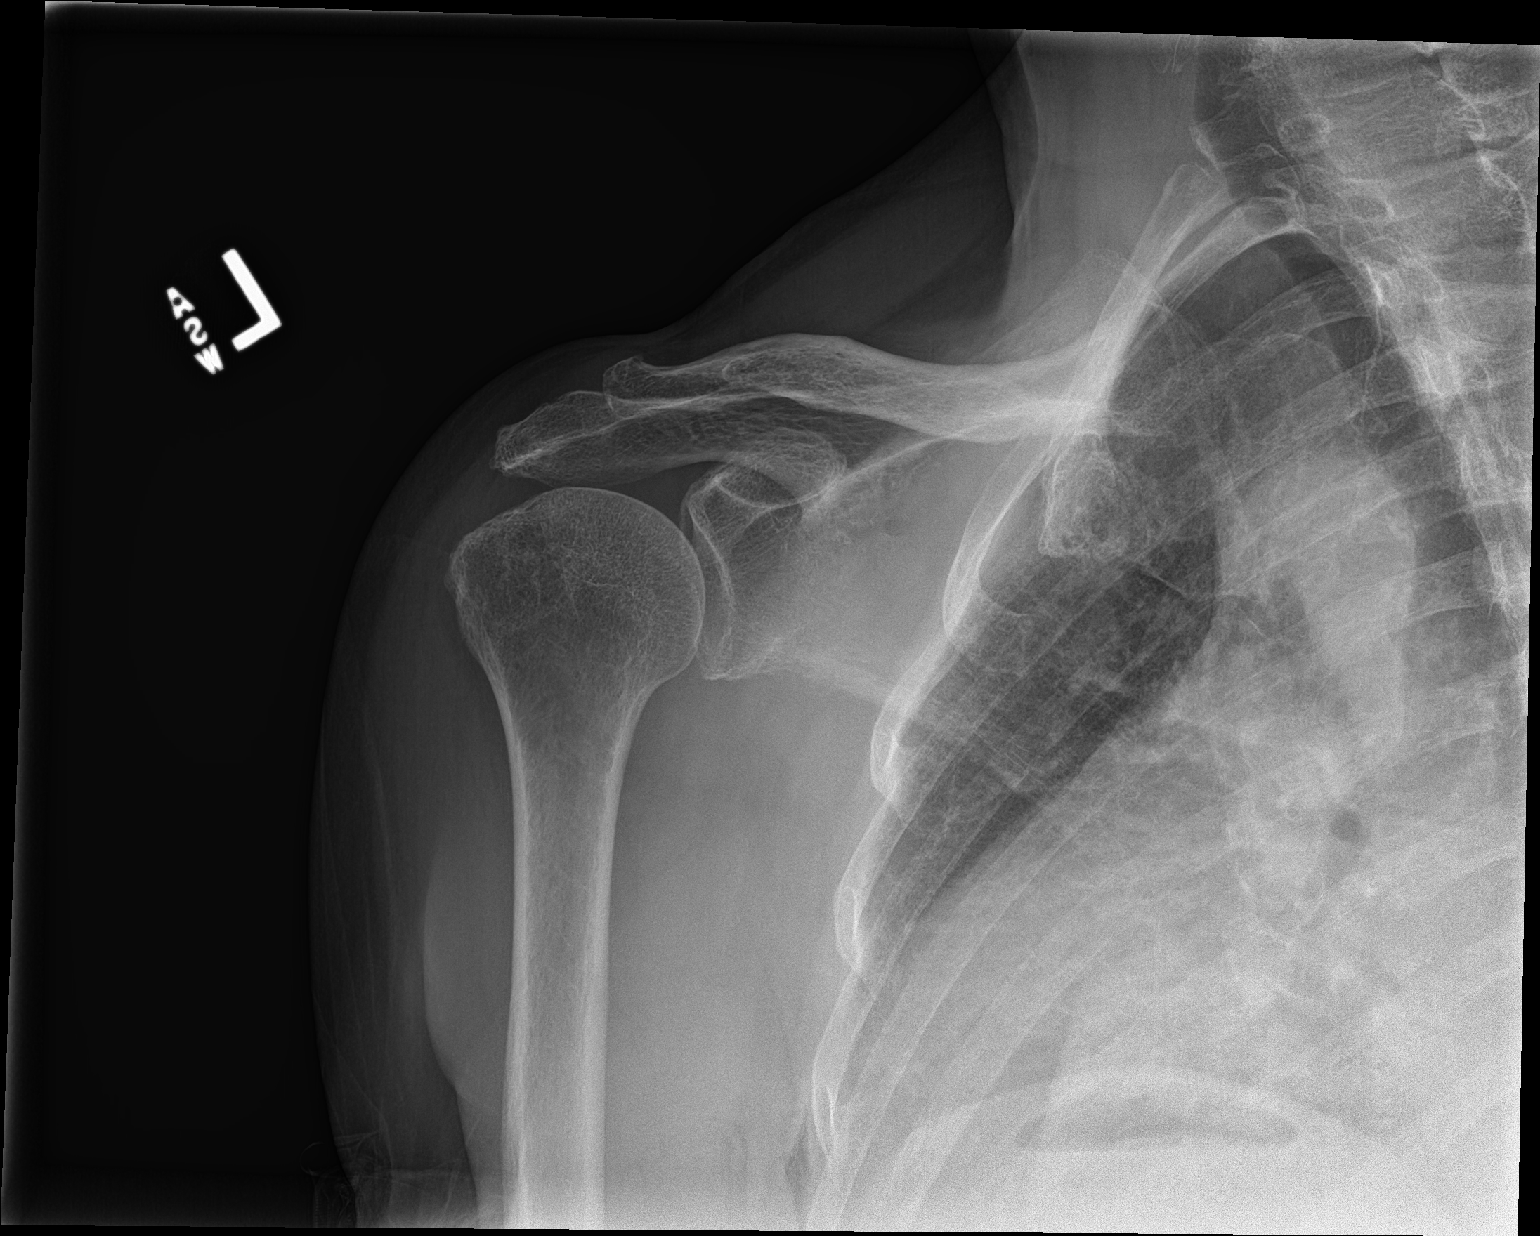

[shoulder axial]
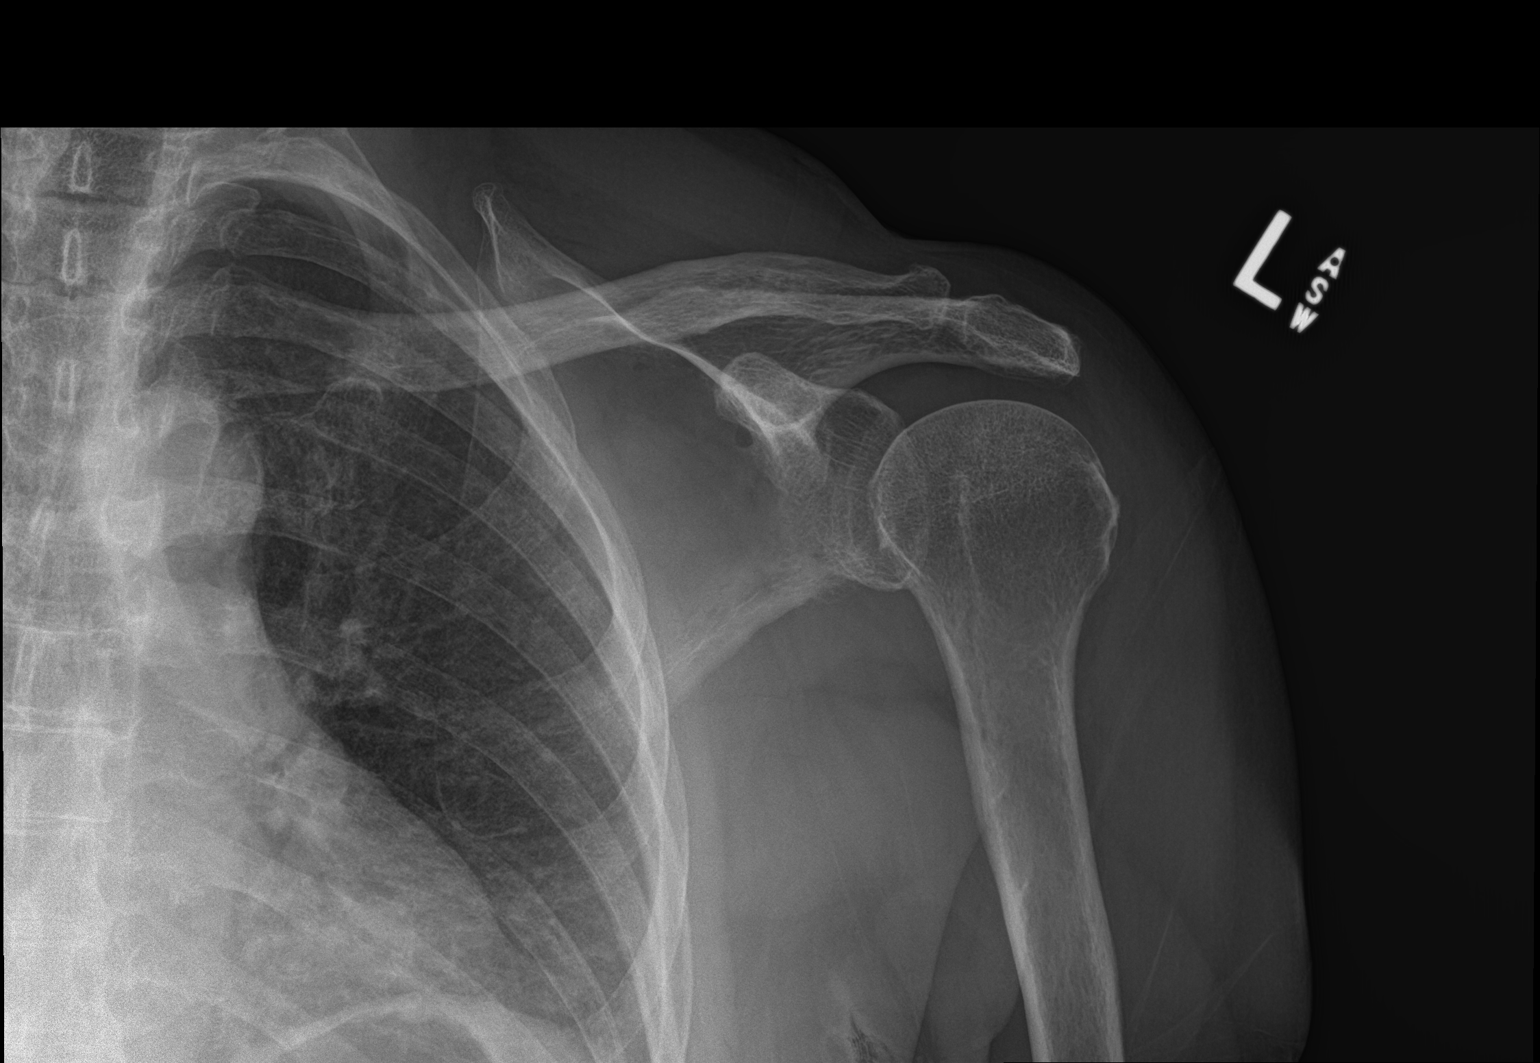

[shoulder y view (2 of 2)]
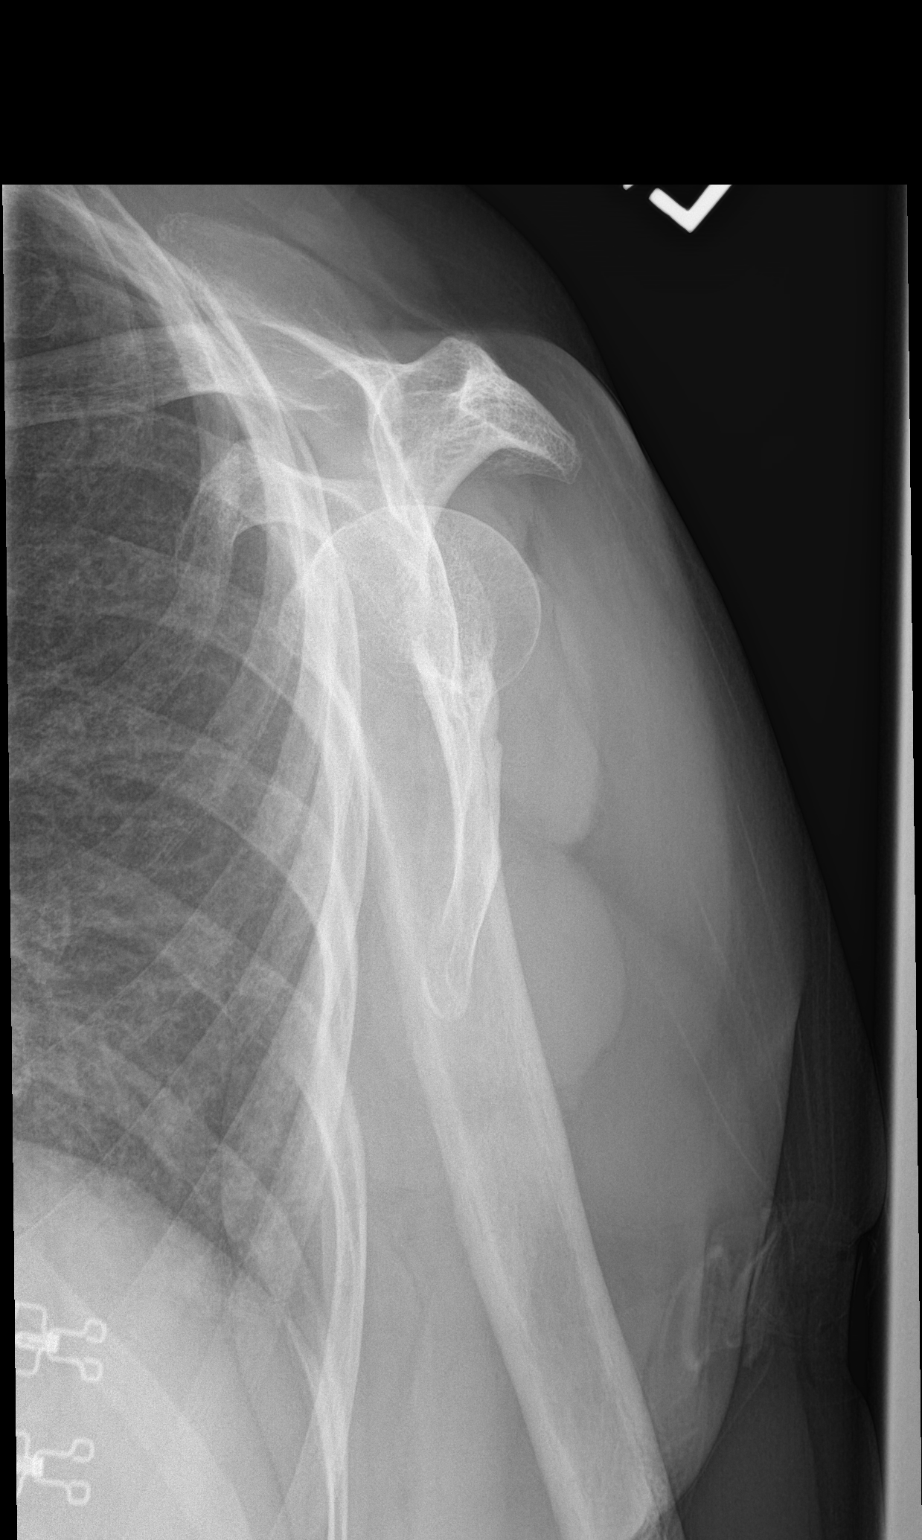

[3 of 3 positions shown; findings below may reference images not displayed]

FINDINGS: There is no acute fracture or dislocation. The bones are osteopenic.
Mild degenerative changes. The soft tissues are unremarkable.
IMPRESSION: No acute fracture or dislocation.

## 2022-08-23 DIAGNOSIS — E1121 Type 2 diabetes mellitus with diabetic nephropathy: Secondary | ICD-10-CM | POA: Insufficient documentation

## 2022-08-30 ENCOUNTER — Ambulatory Visit
Admission: EM | Admit: 2022-08-30 | Discharge: 2022-08-30 | Disposition: A | Discharge status: Home / Self Care | Payer: Medicare Other

## 2022-08-30 DIAGNOSIS — M79601 Pain in right arm: Secondary | ICD-10-CM

## 2022-08-30 MED ORDER — DICLOFENAC SODIUM 1 % EX GEL: 2.0000 g | Freq: Four times a day (QID) | CUTANEOUS | 0 refills | Status: DC

## 2022-08-30 NOTE — ED Triage Notes (Signed)
Patient reports that she has a tetanus and shingles shot on 08/26/2022. Patient reports that her right arm is red and very tender and sore.

## 2022-08-30 NOTE — ED Provider Notes (Signed)
[facility]    CSN: [id number] Arrival date & time: 08/30/22  1128      History   Chief Complaint Chief Complaint  Patient presents with   Arm Pain    Right     HPI Kaytlynn Kochan is a 76 y.o. female.   Patient presents for evaluation of intermittent erythema and swelling and stiff and soreness of the right upper extremity. 08/26/2022 patient received tetanus and shingles vaccine into the right upper extremity.  Erythema and swelling are predominant in the morning and improves throughout the day.  Stiffness and soreness has been constant.  Has not attempted treatment of symptoms.  Endorses that she went back to the [facility] who administer the vaccinations and she was told that symptoms would improve with time.     Past Medical History:  Diagnosis Date   Arthritis    Hyperlipidemia    Hypertension     There are no problems to display for this patient.   Past Surgical History:  Procedure Laterality Date   TUBAL LIGATION      OB History   No obstetric history on file.      Home Medications    Prior to Admission medications   Medication Sig Start Date End Date Taking? Authorizing Provider  aspirin EC 81 MG tablet Take by mouth. 03/09/18  Yes [provider]  baclofen (LIORESAL) 10 MG tablet Take 1 tablet (10 mg total) by mouth at bedtime as needed for muscle spasms. 08/08/21  Yes Danton Clap, PA-C  Cholecalciferol (VITAMIN D-1000 MAX ST) 25 MCG (1000 UT) tablet Take by mouth. 03/09/18  Yes [provider]  lisinopril (PRINIVIL,ZESTRIL) 10 MG tablet Take 10 mg by mouth daily.   Yes [provider]  metFORMIN (GLUCOPHAGE-XR) 500 MG 24 hr tablet Take 500 mg by mouth daily. 07/26/21  Yes [provider]  simvastatin (ZOCOR) 40 MG tablet Take 40 mg by mouth at bedtime. 07/26/21  Yes [provider]  triamcinolone ointment (KENALOG) 0.5 % Apply 1 application topically 2 (two) times daily. 08/08/21  Yes Danton Clap, PA-C  Biotin 1 MG CAPS Take by mouth.    [provider]  predniSONE (DELTASONE) 10 MG tablet Take 5 tabs PO on day 1 and decrease 1 tab daily until complete 08/08/21   Danton Clap, PA-C    Family History Family History  Problem Relation Age of Onset   Hypertension Mother     Social History Social History   Tobacco Use   Smoking status: Never   Smokeless tobacco: Never  Vaping Use   Vaping Use: Never used  Substance Use Topics   Alcohol use: No   Drug use: No     Allergies   Patient has no known allergies.   Review of Systems Review of Systems  Constitutional: Negative.   Respiratory: Negative.    Cardiovascular: Negative.   Musculoskeletal:  Positive for myalgias. Negative for arthralgias, back pain, gait problem, joint swelling, neck pain and neck stiffness.  Skin: Negative.   Neurological: Negative.      Physical Exam Triage Vital Signs ED Triage Vitals  Enc Vitals Group     BP 08/30/22 1137 (!) 106/55     Pulse Rate 08/30/22 1137 68     Resp --      Temp 08/30/22 1137 99.3 F (37.4 C)     Temp Source 08/30/22 1137 Oral     SpO2 08/30/22 1137 95 %  Weight 08/30/22 1135 142 lb 9.6 oz (64.7 kg)     Height 08/30/22 1135 5\' 3"  (1.6 m)     Head Circumference --      Peak Flow --      Pain Score 08/30/22 1135 4     Pain Loc --      Pain Edu? --      Excl. in [place]? --    No data found.  Updated Vital Signs BP (!) 106/55 (BP Location: Left Arm)   Pulse 68   Temp 99.3 F (37.4 C) (Oral)   Ht 5\' 3"  (1.6 m)   Wt 142 lb 9.6 oz (64.7 kg)   SpO2 95%   BMI 25.26 kg/m   Visual Acuity Right Eye Distance:   Left Eye Distance:   Bilateral Distance:    Right Eye Near:   Left Eye Near:    Bilateral Near:     Physical Exam Constitutional:      Appearance: Normal appearance.  Eyes:     Extraocular Movements: Extraocular movements intact.  Skin:    Comments: Mild erythema to the lateral and posterior aspect of the right upper  extremity over the tricep without ecchymosis, swelling or deformity, no point tenderness noted, range of motion intact, 2+ brachial pulse  Neurological:     Mental Status: She is alert and oriented to person, place, and time. Mental status is at baseline.  Psychiatric:        Mood and Affect: Mood normal.        Behavior: Behavior normal.      UC Treatments / Results  Labs (all labs ordered are listed, but only abnormal results are displayed) Labs Reviewed - No data to display  EKG   Radiology No results found.  Procedures Procedures (including critical care time)  Medications Ordered in UC Medications - No data to display  Initial Impression / Assessment and Plan / UC Course  I have reviewed the triage vital signs and the nursing notes.  Pertinent labs & imaging results that were available during my care of the patient were reviewed by me and considered in my medical decision making (see chart for details).  Right arm pain  While there is mild erythema on exam, site does not appear to be infected at this time, discussed with patient, etiology may be result of 2 vaccines into 1 localized area, recommended moving forward separating sites, prescribed diclofenac gel to be applied as needed to help with stiffness and soreness and advised to monitor closely, given strict precautions that if symptoms are still present 1 week from now to return for reevaluation Final Clinical Impressions(s) / UC Diagnoses   Final diagnoses:  None   Discharge Instructions   None    ED Prescriptions   None    PDMP not reviewed this encounter.   Hans Eden, NP 08/30/22 1202

## 2022-08-30 NOTE — Discharge Instructions (Signed)
On exam there is some mild redness to the arm but there is no swelling in the arm does not appear to be infected, your symptoms are most likely related to receiving 2 injections at the same time in a similar location and should improve with time  You may use the diclofenac gel prescribed every 6 hours as needed for comfort  You may also use ice or heat over the affected area in 10 to 15-minute intervals  If you are still having symptoms 1 week from now I would recommend reevaluation  Your vital signs are stable and your blood pressure is not elevated it is beating in a regular pace and rhythm please monitor your pounding heartbeat closely and if symptoms do not resolve please follow-up with your primary doctor

## 2022-09-24 DIAGNOSIS — M25511 Pain in right shoulder: Secondary | ICD-10-CM | POA: Insufficient documentation

## 2022-10-31 DIAGNOSIS — N1831 Chronic kidney disease, stage 3a: Secondary | ICD-10-CM | POA: Insufficient documentation

## 2022-12-31 ENCOUNTER — Ambulatory Visit: Payer: Medicare Other | Attending: Orthopedic Surgery | Admitting: Physical Therapy

## 2022-12-31 DIAGNOSIS — G8929 Other chronic pain: Secondary | ICD-10-CM | POA: Diagnosis present

## 2022-12-31 DIAGNOSIS — R293 Abnormal posture: Secondary | ICD-10-CM | POA: Diagnosis present

## 2022-12-31 DIAGNOSIS — M25612 Stiffness of left shoulder, not elsewhere classified: Secondary | ICD-10-CM | POA: Diagnosis present

## 2022-12-31 DIAGNOSIS — M6281 Muscle weakness (generalized): Secondary | ICD-10-CM | POA: Insufficient documentation

## 2022-12-31 DIAGNOSIS — M25512 Pain in left shoulder: Secondary | ICD-10-CM | POA: Insufficient documentation

## 2022-12-31 DIAGNOSIS — M25511 Pain in right shoulder: Secondary | ICD-10-CM | POA: Diagnosis present

## 2022-12-31 DIAGNOSIS — M25611 Stiffness of right shoulder, not elsewhere classified: Secondary | ICD-10-CM | POA: Diagnosis present

## 2022-12-31 NOTE — Therapy (Signed)
OUTPATIENT PHYSICAL THERAPY UPPER EXTREMITY EVALUATION    Patient Name: Virginia Pollard MRN: [id number] DOB:1946/10/25, 77 y.o., female Today's Date: 01/01/2023  END OF SESSION:  PT End of Session - 12/31/22 1418     Visit Number 1    Number of Visits 13    Date for PT Re-Evaluation 02/11/23    Authorization - Number of Visits --    Progress Note Due on Visit --    PT Start Time 1420    PT Stop Time 1530    PT Time Calculation (min) 70 min    Activity Tolerance Patient tolerated treatment well    Behavior During Therapy WFL for tasks assessed/performed             Past Medical History:  Diagnosis Date   Arthritis    Hyperlipidemia    Hypertension    Past Surgical History:  Procedure Laterality Date   TUBAL LIGATION     There are no problems to display for this patient.   [facility]   REFERRING PROVIDER: Virgina Organ, [facility] Provider   REFERRING DIAG: Acute pain of R shoulder    Bilateral shoulder impingment  THERAPY DIAG:  Shoulder joint stiffness, bilateral  Muscle weakness (generalized)  Chronic pain of both shoulders  Rationale for Evaluation and Treatment: Rehabilitation  ONSET DATE: Chronic, recently exacerbated the last 3/4 months   SUBJECTIVE:                                                                                                                                                                                      SUBJECTIVE STATEMENT: Pt. Has previous hx. Of L shoulder pain resolved with PT. Pt. Still has some stiffness in L shoulder. Pt. Reports R is c/c at this time and the pain has been severe(10/10 NPS at times). Pt. States she fell on the L shoulder 1 year ago which brought on the previous PT treatment that has since been resolved. Pt. States difficulty with any overhead activities and is limited in tasks such as hair care and reaching up to retrieve/put up dishes.  PERTINENT HISTORY: Pt.  Has hx. Of chronic shoulder pain in both the R/L shoulder. Pt. States a "black mark" was found on her lungs on a previous scan that she will be following up on with her PCP at the end of February.  Medication Sig Start Date End Date Taking? Authorizing Provider  aspirin EC 81 MG tablet Take by mouth. 03/09/18   Yes [provider]  baclofen (LIORESAL) 10 MG tablet Take 1 tablet (10 mg total) by mouth at bedtime as needed for muscle  spasms. 08/08/21   Yes Shirlee Latch, PA-C  Cholecalciferol (VITAMIN D-1000 MAX ST) 25 MCG (1000 UT) tablet Take by mouth. 03/09/18   Yes [provider]  lisinopril (PRINIVIL,ZESTRIL) 10 MG tablet Take 10 mg by mouth daily.     Yes [provider]  metFORMIN (GLUCOPHAGE-XR) 500 MG 24 hr tablet Take 500 mg by mouth daily. 07/26/21   Yes [provider]  simvastatin (ZOCOR) 40 MG tablet Take 40 mg by mouth at bedtime. 07/26/21   Yes [provider]  triamcinolone ointment (KENALOG) 0.5 % Apply 1 application topically 2 (two) times daily. 08/08/21   Yes Shirlee Latch, PA-C  Biotin 1 MG CAPS Take by mouth.       [provider]  predniSONE (DELTASONE) 10 MG tablet Take 5 tabs PO on day 1 and decrease 1 tab daily until complete 08/08/21     Shirlee Latch, PA-C      Family History      Family History  Problem Relation Age of Onset   Hypertension Mother        Social History Social History        Tobacco Use   Smoking status: Never   Smokeless tobacco: Never  Vaping Use   Vaping Use: Never used  Substance Use Topics   Alcohol use: No   Drug use: No        Allergies              Patient has no known allergies.     Review of Systems Review of Systems  Constitutional: Negative.   Respiratory: Negative.    Cardiovascular: Negative.   Musculoskeletal:  Positive for myalgias. Negative for arthralgias, back pain, gait problem, joint swelling, neck pain and neck stiffness.  Skin: Negative.   Neurological:  Negative.    PAIN:  Are you having pain? Yes L: 5/10 NPS R: 7/10 NPS  Pain location: B upper middle shoulder  Pain description: sharp with initiation of activity, constant dull ache Aggravating factors: reaching overhead Relieving factors: pullys, snow angels, shots, ibuprofen  Worst: 10/10 NPS Best: 4/10 NPS  PRECAUTIONS: None  WEIGHT BEARING RESTRICTIONS: No  FALLS:  Has patient fallen in last 6 months? Yes. Number of falls 1  LIVING ENVIRONMENT: Lives with: lives alone Lives in: House/apartment Stairs: No Has following equipment at home: None  OCCUPATION: Retired. Active in the church.   PLOF: Independent  PATIENT GOALS: Pt. Wants to get back to working in her yard, traveling, and live a full life without pain.   NEXT MD VISIT: 01/30/2023  OBJECTIVE:   DIAGNOSTIC FINDINGS:  See MD note  PATIENT SURVEYS :  FOTO initial 51/ goal 57  COGNITION: Overall cognitive status: Within functional limits for tasks assessed     SENSATION: WFL  POSTURE: WNL  UPPER EXTREMITY ROM:   Active ROM Right eval Left eval  Shoulder flexion 142 deg. 134 deg.  Shoulder extension [facility]  Shoulder abduction 76 deg. 79 deg.  Shoulder adduction    Shoulder internal rotation 70 deg. 51 deg.  Shoulder external rotation 69 deg. 39 deg.  Elbow flexion [facility] WFL  Elbow extension [facility]  Wrist flexion [facility] WFL  Wrist extension [facility] WFL  Wrist ulnar deviation    Wrist radial deviation    Wrist pronation    Wrist supination    (Blank rows = not tested)  Seated shoulder flexion: L: 93 deg./  R: 122 deg.  UPPER EXTREMITY MMT:  MMT Right eval Left eval  Shoulder flexion 4-/5 4-/5  Shoulder extension    Shoulder abduction 4/5 4/5  Shoulder adduction    Shoulder internal rotation 5/5 5/5  Shoulder external rotation 4-/5 4-/5  Middle trapezius 3+/5 3+/5  Lower trapezius    Elbow flexion 4/5 4/5  Elbow extension 4/5 4/5  Wrist flexion    Wrist extension    Wrist ulnar  deviation    Wrist radial deviation    Wrist pronation    Wrist supination    Grip strength (lbs)    (Blank rows = not tested)  JOINT MOBILITY TESTING:  PT performed B :  P-A- hypomobile A-P- hypomobile Inf. Glide- hypomobile  PALPATION:  PT. Noted some tenderness to palpation around both the R and L glenohumeral joint line.    TODAY'S TREATMENT:                                                                                                                                         DATE: 12/31/2022  -[employer] UBE bike 3 min. Fwd/3 min. Bkwd.  -See HEP.   PATIENT EDUCATION: Education details: Pt. Educated on strength and mobility deficits found during PT evaluation. Pt. Educated on prognosis, treatment POC, and frequency of treatment. PT demonstrated HEP.  Person educated: Patient Education method: Explanation, Demonstration, Tactile cues, Verbal cues, and Handouts Education comprehension: verbalized understanding and returned demonstration  HOME EXERCISE PROGRAM: Access Code: [email or web address] URL: [email or web address] Date: 12/31/2022 Prepared by: Dorene Grebe Exercises - Seated Shoulder Flexion AAROM with Pulley Behind - 1 x daily - 7 x weekly - 3 sets - 10 reps - Seated Shoulder Abduction AAROM with Pulley Behind - 1 x daily - 7 x weekly - 3 sets - 10 reps - Standing Shoulder Internal Rotation AAROM with Pulley - 1 x daily - 7 x weekly - 3 sets - 10 reps - Standing External Rotation in Abduction AAROM with Pulley - 1 x daily - 7 x weekly - 3 sets - 10 reps - Supine Shoulder Flexion AAROM with Dowel - 1 x daily - 7 x weekly - 3 sets - 10 reps   ASSESSMENT:  CLINICAL IMPRESSION: Patient is a 77 y.o. female who was seen today for physical therapy evaluation and treatment for R shoulder pain. Pt. has history of B shoulder pain but the R shoulder is currently more symptomatic that the right. Pt. Has pain of 5/10 on NPS in her L shoulder and 7/10 on NPS in the L shoulder. Pt. Has  limited B shoulder mobility that limits her functional movement and ability to perform ADL's. Pt. demonstrated significant weakness in UE strength during MMT. Pt. Demonstrated > B shoulder ROM in supine vs. sitting due to the assistance of gravity showing that the patient is limited by weakness as well as limited glenohumeral joint space. Pt. Is motivated to increase strength and mobility as well as  decrease pain to return to gardening and performing ADL's such as hair grooming without compensation of other joints/muscles. Pt. Has good prognosis for treatment but is moderately complex due to the chronicity of the patients condition. Pt. Demonstrates no LE issues and has no problems with ambulation. Pt. Will benefit from skilled PT services to improve her mobility, strength, and pain level to allow her to perform hobbies and ADL's at a functional level.    OBJECTIVE IMPAIRMENTS: Abnormal gait, decreased activity tolerance, decreased endurance, decreased mobility, decreased ROM, decreased strength, hypomobility, impaired UE functional use, and pain.   ACTIVITY LIMITATIONS: carrying, lifting, dressing, reach over head, and hygiene/grooming  PARTICIPATION LIMITATIONS: cleaning, community activity, and yard work  PERSONAL FACTORS: Age, Past/current experiences, and Time since onset of injury/illness/exacerbation are also affecting patient's functional outcome.   REHAB POTENTIAL: Good  CLINICAL DECISION MAKING: Evolving/moderate complexity  EVALUATION COMPLEXITY: Moderate  GOALS: Goals reviewed with patient? Yes  SHORT TERM GOALS: Target date: 01/21/2023  Pt. Will be Independent with HEP to increase B shoulder flexion and abduction by 10 deg. To increase functional mobility of her UE's. Baseline: see above. Goal status: INITIAL   LONG TERM GOALS: Target date: 02/11/2023  Pt. Will increase FOTO to 62 to improve pain-free mobility.  Baseline: 51/62 Goal status: INITIAL  2.  Pt. Will report  <3/10 B shoulder pain with overhead reaching/ lifting tasks.  Baseline: 10/10 NPS Goal status: INITIAL  3.  PT. Will improve B shoulder strength a 1/2 muscle grade in flexion, abduction, and middle trap to improve functional movement tasks such as putting away dishes and performing hair care.  Baseline: See above. Goal status: INITIAL   PLAN: PT FREQUENCY: 2x/week  PT DURATION: 6 weeks  PLANNED INTERVENTIONS: Therapeutic exercises, Therapeutic activity, Neuromuscular re-education, Gait training, Patient/Family education, Self Care, Joint mobilization, Dry Needling, Cryotherapy, Moist heat, Traction, Ultrasound, and Manual therapy  PLAN FOR NEXT SESSION: Begin strengthening program, shoulder mobilizations for mobility  Cammie Mcgee, PT, DPT # [id number] Everlene Other, Student-PT 01/01/2023, 8:51 AM

## 2023-01-02 ENCOUNTER — Encounter: Payer: Self-pay | Admitting: Physical Therapy

## 2023-01-02 ENCOUNTER — Ambulatory Visit: Payer: Medicare Other | Admitting: Physical Therapy

## 2023-01-02 DIAGNOSIS — M25611 Stiffness of right shoulder, not elsewhere classified: Secondary | ICD-10-CM | POA: Diagnosis not present

## 2023-01-02 DIAGNOSIS — G8929 Other chronic pain: Secondary | ICD-10-CM

## 2023-01-02 DIAGNOSIS — M25612 Stiffness of left shoulder, not elsewhere classified: Secondary | ICD-10-CM

## 2023-01-02 DIAGNOSIS — M6281 Muscle weakness (generalized): Secondary | ICD-10-CM

## 2023-01-02 NOTE — Therapy (Signed)
OUTPATIENT PHYSICAL THERAPY UPPER EXTREMITY TREATMENT    Patient Name: Virginia Pollard MRN: [id number] DOB:[birth year], 77 y.o., female Today's Date: 01/02/2023  END OF SESSION:  PT End of Session - 01/02/23 1432     Visit Number 2    Number of Visits 13    Date for PT Re-Evaluation 02/11/23    PT Start Time 1432    PT Stop Time 1517    PT Time Calculation (min) 45 min    Activity Tolerance Patient tolerated treatment well    Behavior During Therapy [facility] for tasks assessed/performed             Past Medical History:  Diagnosis Date   Arthritis    Hyperlipidemia    Hypertension    Past Surgical History:  Procedure Laterality Date   TUBAL LIGATION     There are no problems to display for this patient.   [facility] [place] [facility]   REFERRING PROVIDER: Anderson Malta, [facility] Provider   REFERRING DIAG: Acute pain of R shoulder    Bilateral shoulder impingment  THERAPY DIAG:  Shoulder joint stiffness, bilateral  Muscle weakness (generalized)  Chronic pain of both shoulders  Rationale for Evaluation and Treatment: Rehabilitation  ONSET DATE: Chronic, recently exacerbated the last 3/4 months   SUBJECTIVE:                                                                                                                                                                                      SUBJECTIVE STATEMENT: Pt. Has previous hx. Of L shoulder pain resolved with PT. Pt. Still has some stiffness in L shoulder. Pt. Reports R is c/c at this time and the pain has been severe(10/10 NPS at times). Pt. States she fell on the L shoulder 1 year ago which brought on the previous PT treatment that has since been resolved. Pt. States difficulty with any overhead activities and is limited in tasks such as hair care and reaching up to retrieve/put up dishes.  PERTINENT HISTORY: Pt. Has hx. Of chronic shoulder pain in both the R/L shoulder. Pt. States a  "black mark" was found on her lungs on a previous scan that she will be following up on with her PCP at the end of February.  Medication Sig Start Date End Date Taking? Authorizing Provider  aspirin EC 81 MG tablet Take by mouth. 03/09/18   Yes [provider]  baclofen (LIORESAL) 10 MG tablet Take 1 tablet (10 mg total) by mouth at bedtime as needed for muscle spasms. 08/08/21   Yes Danton Clap, PA-C  Cholecalciferol (VITAMIN D-1000 MAX ST) 25 MCG (1000  UT) tablet Take by mouth. 03/09/18   Yes [provider]  lisinopril (PRINIVIL,ZESTRIL) 10 MG tablet Take 10 mg by mouth daily.     Yes [provider]  metFORMIN (GLUCOPHAGE-XR) 500 MG 24 hr tablet Take 500 mg by mouth daily. 07/26/21   Yes [provider]  simvastatin (ZOCOR) 40 MG tablet Take 40 mg by mouth at bedtime. 07/26/21   Yes [provider]  triamcinolone ointment (KENALOG) 0.5 % Apply 1 application topically 2 (two) times daily. 08/08/21   Yes Danton Clap, PA-C  Biotin 1 MG CAPS Take by mouth.       [provider]  predniSONE (DELTASONE) 10 MG tablet Take 5 tabs PO on day 1 and decrease 1 tab daily until complete 08/08/21     Danton Clap, PA-C      Family History      Family History  Problem Relation Age of Onset   Hypertension Mother        Social History Social History        Tobacco Use   Smoking status: Never   Smokeless tobacco: Never  Vaping Use   Vaping Use: Never used  Substance Use Topics   Alcohol use: No   Drug use: No        Allergies              Patient has no known allergies.     Review of Systems Review of Systems  Constitutional: Negative.   Respiratory: Negative.    Cardiovascular: Negative.   Musculoskeletal:  Positive for myalgias. Negative for arthralgias, back pain, gait problem, joint swelling, neck pain and neck stiffness.  Skin: Negative.   Neurological: Negative.    PAIN:  Are you having pain? Yes L: 5/10 NPS R: 7/10 NPS   Pain location: B upper middle shoulder  Pain description: sharp with initiation of activity, constant dull ache Aggravating factors: reaching overhead Relieving factors: pullys, snow angels, shots, ibuprofen  Worst: 10/10 NPS Best: 4/10 NPS  PRECAUTIONS: None  WEIGHT BEARING RESTRICTIONS: No  FALLS:  Has patient fallen in last 6 months? Yes. Number of falls 1  LIVING ENVIRONMENT: Lives with: lives alone Lives in: House/apartment Stairs: No Has following equipment at home: None  OCCUPATION: Retired. Active in the church.   PLOF: Independent  PATIENT GOALS: Pt. Wants to get back to working in her yard, traveling, and live a full life without pain.   NEXT MD VISIT: 01/30/2023  OBJECTIVE:   DIAGNOSTIC FINDINGS:  See MD note  PATIENT SURVEYS :  FOTO initial 51/ goal 10  COGNITION: Overall cognitive status: Within functional limits for tasks assessed     SENSATION: WFL  POSTURE: WNL  UPPER EXTREMITY ROM:   Active ROM Right eval Left eval  Shoulder flexion 142 deg. 134 deg.  Shoulder extension [facility]  Shoulder abduction 76 deg. 79 deg.  Shoulder adduction    Shoulder internal rotation 70 deg. 51 deg.  Shoulder external rotation 69 deg. 39 deg.  Elbow flexion [facility] WFL  Elbow extension [facility]  Wrist flexion [facility] WFL  Wrist extension [facility] WFL  Wrist ulnar deviation    Wrist radial deviation    Wrist pronation    Wrist supination    (Blank rows = not tested)  Seated shoulder flexion: L: 93 deg./  R: 122 deg.  UPPER EXTREMITY MMT:  MMT Right eval Left eval  Shoulder flexion 4-/5 4-/5  Shoulder extension    Shoulder abduction  4/5 4/5  Shoulder adduction    Shoulder internal rotation 5/5 5/5  Shoulder external rotation 4-/5 4-/5  Middle trapezius 3+/5 3+/5  Lower trapezius    Elbow flexion 4/5 4/5  Elbow extension 4/5 4/5  Wrist flexion    Wrist extension    Wrist ulnar deviation    Wrist radial deviation    Wrist pronation    Wrist  supination    Grip strength (lbs)    (Blank rows = not tested)  JOINT MOBILITY TESTING:  PT performed B :  P-A- hypomobile A-P- hypomobile Inf. Glide- hypomobile  PALPATION:  PT. Noted some tenderness to palpation around both the R and L glenohumeral joint line.    TODAY'S TREATMENT:                                                                                                                                         DATE: 01/02/2023  Subjective: Pt. Arrived to PT reporting 5/10 B shoulder pain on NPS. Pt. Reports compliance with HEP with no concerns. Pt. Has no increase in pain with HEP or UE bike.   There Ex.: -Biodex UBE bike 3 min. Fwd/3 min. Bkwd.   -Scapular retraction with RTB in front of mirror for feedback on form. 3x10 reps. Pt. Cued to keep upper trap depressed and pull scapulas together in a controlled manner.   Manual Therapy: - Seated ER/IR/Ext. shoulder isometrics 1x10 each side. Mod. Resistance applied. No pain increase.   -Supine flex./abd. isometrics in supine. 1x10 each. No increase in pain.     -Supine B glenohumeral joint mobilizations: A-P, Inf. Glide. 2x20 bouts. Slight tenderness with pressure to shoulder joint that was relieved with adjustment of PT hand placement.    PATIENT EDUCATION: Education details: Pt. Educated on strength and mobility deficits found during PT evaluation. Pt. Educated on prognosis, treatment POC, and frequency of treatment. PT demonstrated HEP.  Person educated: Patient Education method: Explanation, Demonstration, Tactile cues, Verbal cues, and Handouts Education comprehension: verbalized understanding and returned demonstration  HOME EXERCISE PROGRAM: Access Code: [email or web address] URL: [email or web address] Date: 12/31/2022 Prepared by: Dorcas Carrow Exercises - Seated Shoulder Flexion AAROM with Pulley Behind - 1 x daily - 7 x weekly - 3 sets - 10 reps - Seated Shoulder Abduction AAROM with Pulley Behind - 1 x daily - 7 x  weekly - 3 sets - 10 reps - Standing Shoulder Internal Rotation AAROM with Pulley - 1 x daily - 7 x weekly - 3 sets - 10 reps - Standing External Rotation in Abduction AAROM with Pulley - 1 x daily - 7 x weekly - 3 sets - 10 reps - Supine Shoulder Flexion AAROM with Dowel - 1 x daily - 7 x weekly - 3 sets - 10 reps   ASSESSMENT:  CLINICAL IMPRESSION: Patient arrived to physical therapy reporting 5/10 B shoulder pain. Pt. Is motivated to increase strength and  mobility as well as decrease pain to return to gardening and performing ADL's such as hair grooming without compensation of other joints/muscles. Pt. Reported no increase in B shoulder pain during today's treatment. Pt. Continues to show significant deficits in B shoulder strength and mobility. Pt. Required some rest breaks due to muscle fatigue and limited endurance. Pt. Cued pt. to keep her upper trap depressed and pull scapulas together in a controlled manner during scap. Retraction TB exercise. Pt. Had slight tenderness with pressure to shoulder joint during mobilizations that was relieved with adjustment of PT hand placement. Pt. Will benefit from skilled PT services to improve her mobility, strength, and pain level to allow her to perform hobbies and ADL's at a functional level.    OBJECTIVE IMPAIRMENTS: Abnormal gait, decreased activity tolerance, decreased endurance, decreased mobility, decreased ROM, decreased strength, hypomobility, impaired UE functional use, and pain.   ACTIVITY LIMITATIONS: carrying, lifting, dressing, reach over head, and hygiene/grooming  PARTICIPATION LIMITATIONS: cleaning, community activity, and yard work  PERSONAL FACTORS: Age, Past/current experiences, and Time since onset of injury/illness/exacerbation are also affecting patient's functional outcome.   REHAB POTENTIAL: Good  CLINICAL DECISION MAKING: Evolving/moderate complexity  EVALUATION COMPLEXITY: Moderate  GOALS: Goals reviewed with patient?  Yes  SHORT TERM GOALS: Target date: 01/21/2023  Pt. Will be Independent with HEP to increase B shoulder flexion and abduction by 10 deg. To increase functional mobility of her UE's. Baseline: see above. Goal status: INITIAL   LONG TERM GOALS: Target date: 02/11/2023  Pt. Will increase FOTO to 62 to improve pain-free mobility.  Baseline: 51/62 Goal status: INITIAL  2.  Pt. Will report <3/10 B shoulder pain with overhead reaching/ lifting tasks.  Baseline: 10/10 NPS Goal status: INITIAL  3.  PT. Will improve B shoulder strength a 1/2 muscle grade in flexion, abduction, and middle trap to improve functional movement tasks such as putting away dishes and performing hair care.  Baseline: See above. Goal status: INITIAL   PLAN: PT FREQUENCY: 2x/week  PT DURATION: 6 weeks  PLANNED INTERVENTIONS: Therapeutic exercises, Therapeutic activity, Neuromuscular re-education, Gait training, Patient/Family education, Self Care, Joint mobilization, Dry Needling, Cryotherapy, Moist heat, Traction, Ultrasound, and Manual therapy  PLAN FOR NEXT SESSION:  Continue strengthening program, shoulder mobilizations for mobility  Pura Spice, PT, DPT # [place], Student-PT 01/02/2023, 7:09 PM

## 2023-01-07 ENCOUNTER — Ambulatory Visit: Payer: Medicare Other

## 2023-01-07 DIAGNOSIS — M25612 Stiffness of left shoulder, not elsewhere classified: Secondary | ICD-10-CM

## 2023-01-07 DIAGNOSIS — M6281 Muscle weakness (generalized): Secondary | ICD-10-CM

## 2023-01-07 DIAGNOSIS — G8929 Other chronic pain: Secondary | ICD-10-CM

## 2023-01-07 DIAGNOSIS — R293 Abnormal posture: Secondary | ICD-10-CM

## 2023-01-07 DIAGNOSIS — M25611 Stiffness of right shoulder, not elsewhere classified: Secondary | ICD-10-CM

## 2023-01-07 NOTE — Therapy (Signed)
OUTPATIENT PHYSICAL THERAPY UPPER EXTREMITY TREATMENT    Patient Name: Virginia Pollard MRN: [id number] DOB:1945-12-29, 77 y.o., female Today's Date: 01/07/2023  END OF SESSION:  PT End of Session - 01/07/23 1432     Visit Number 3    Number of Visits 13    Date for PT Re-Evaluation 02/11/23    PT Start Time 0230    PT Stop Time 0323    PT Time Calculation (min) 53 min    Activity Tolerance Patient tolerated treatment well    Behavior During Therapy [facility] for tasks assessed/performed             Past Medical History:  Diagnosis Date   Arthritis    Hyperlipidemia    Hypertension    Past Surgical History:  Procedure Laterality Date   TUBAL LIGATION     There are no problems to display for this patient.   PCP: [facility] [place] [facility] - General   REFERRING PROVIDER: Anderson Malta, [facility] Provider   REFERRING DIAG: Acute pain of R shoulder    Bilateral shoulder impingment  THERAPY DIAG:  Shoulder joint stiffness, bilateral  Muscle weakness (generalized)  Chronic pain of both shoulders  Chronic left shoulder pain  Decreased range of motion of left shoulder  Abnormal posture  Rationale for Evaluation and Treatment: Rehabilitation  ONSET DATE: Chronic, recently exacerbated the last 3/4 months   SUBJECTIVE:                                                                                                                                                                                      SUBJECTIVE STATEMENT: Pt. Has previous hx. Of L shoulder pain resolved with PT. Pt. Still has some stiffness in L shoulder. Pt. Reports R is c/c at this time and the pain has been severe(10/10 NPS at times). Pt. States she fell on the L shoulder 1 year ago which brought on the previous PT treatment that has since been resolved. Pt. States difficulty with any overhead activities and is limited in tasks such as hair care and reaching up to retrieve/put up  dishes.  PERTINENT HISTORY: Pt. Has hx. Of chronic shoulder pain in both the R/L shoulder. Pt. States a "black mark" was found on her lungs on a previous scan that she will be following up on with her PCP at the end of February.  Medication Sig Start Date End Date Taking? Authorizing Provider  aspirin EC 81 MG tablet Take by mouth. 03/09/18   Yes [provider]  baclofen (LIORESAL) 10 MG tablet Take 1 tablet (10 mg total) by mouth at bedtime as needed for muscle spasms. 08/08/21  Yes Danton Clap, PA-C  Cholecalciferol (VITAMIN D-1000 MAX ST) 25 MCG (1000 UT) tablet Take by mouth. 03/09/18   Yes [provider]  lisinopril (PRINIVIL,ZESTRIL) 10 MG tablet Take 10 mg by mouth daily.     Yes [provider]  metFORMIN (GLUCOPHAGE-XR) 500 MG 24 hr tablet Take 500 mg by mouth daily. 07/26/21   Yes [provider]  simvastatin (ZOCOR) 40 MG tablet Take 40 mg by mouth at bedtime. 07/26/21   Yes [provider]  triamcinolone ointment (KENALOG) 0.5 % Apply 1 application topically 2 (two) times daily. 08/08/21   Yes Danton Clap, PA-C  Biotin 1 MG CAPS Take by mouth.       [provider]  predniSONE (DELTASONE) 10 MG tablet Take 5 tabs PO on day 1 and decrease 1 tab daily until complete 08/08/21     Danton Clap, PA-C      Family History      Family History  Problem Relation Age of Onset   Hypertension Mother        Social History Social History        Tobacco Use   Smoking status: Never   Smokeless tobacco: Never  Vaping Use   Vaping Use: Never used  Substance Use Topics   Alcohol use: No   Drug use: No        Allergies              Patient has no known allergies.     Review of Systems Review of Systems  Constitutional: Negative.   Respiratory: Negative.    Cardiovascular: Negative.   Musculoskeletal:  Positive for myalgias. Negative for arthralgias, back pain, gait problem, joint swelling, neck pain and neck stiffness.   Skin: Negative.   Neurological: Negative.    PAIN:  Are you having pain? Yes L: 5/10 NPS R: 7/10 NPS  Pain location: B upper middle shoulder  Pain description: sharp with initiation of activity, constant dull ache Aggravating factors: reaching overhead Relieving factors: pullys, snow angels, shots, ibuprofen  Worst: 10/10 NPS Best: 4/10 NPS  PRECAUTIONS: None  WEIGHT BEARING RESTRICTIONS: No  FALLS:  Has patient fallen in last 6 months? Yes. Number of falls 1  LIVING ENVIRONMENT: Lives with: lives alone Lives in: House/apartment Stairs: No Has following equipment at home: None  OCCUPATION: Retired. Active in the church.   PLOF: Independent  PATIENT GOALS: Pt. Wants to get back to working in her yard, traveling, and live a full life without pain.   NEXT MD VISIT: 01/30/2023  OBJECTIVE:   DIAGNOSTIC FINDINGS:  See MD note  PATIENT SURVEYS :  FOTO initial 51/ goal 40  COGNITION: Overall cognitive status: Within functional limits for tasks assessed     SENSATION: WFL  POSTURE: WNL  UPPER EXTREMITY ROM:   Active ROM Right eval Left eval  Shoulder flexion 142 deg. 134 deg.  Shoulder extension [facility]  Shoulder abduction 76 deg. 79 deg.  Shoulder adduction    Shoulder internal rotation 70 deg. 51 deg.  Shoulder external rotation 69 deg. 39 deg.  Elbow flexion [facility] WFL  Elbow extension [facility]  Wrist flexion [facility] WFL  Wrist extension [facility] WFL  Wrist ulnar deviation    Wrist radial deviation    Wrist pronation    Wrist supination    (Blank rows = not tested)  Seated shoulder flexion: L: 93 deg./  R: 122 deg.  UPPER EXTREMITY MMT:  MMT Right eval Left  eval  Shoulder flexion 4-/5 4-/5  Shoulder extension    Shoulder abduction 4/5 4/5  Shoulder adduction    Shoulder internal rotation 5/5 5/5  Shoulder external rotation 4-/5 4-/5  Middle trapezius 3+/5 3+/5  Lower trapezius    Elbow flexion 4/5 4/5  Elbow extension 4/5 4/5  Wrist flexion     Wrist extension    Wrist ulnar deviation    Wrist radial deviation    Wrist pronation    Wrist supination    Grip strength (lbs)    (Blank rows = not tested)  JOINT MOBILITY TESTING:  PT performed B :  P-A- hypomobile A-P- hypomobile Inf. Glide- hypomobile  PALPATION:  PT. Noted some tenderness to palpation around both the R and L glenohumeral joint line.    TODAY'S TREATMENT:                                                                                                                                         DATE: 01/02/2023  Subjective: Pt. Arrived to PT reporting 5/10 B shoulder pain on NPS. Pt. Reports compliance with HEP with no concerns. Pt. Has no increase in pain with HEP or UE bike.   There Ex.: 39mn -Biodex UBE bike 4 min. Fwd/ 4 min. Bkwd -Scapular retraction with #2 2 x 10 reps -Shldr shrugs #2 2 x 20 reps -Wand exs in flexion 2 x 10 reps in supine - Isometric exs to R shldr fle/ext/abd/add/ER/IR  Manual Therapy: 176m  Scapular mobilization in all planes, GH inf/post/lat mobs, PROM to B shldr in all  planes.   PATIENT EDUCATION: Education details: Pt. Educated on strength and mobility deficits found during PT evaluation. Pt. Educated on prognosis, treatment POC, and frequency of treatment. PT demonstrated HEP.  Person educated: Patient Education method: Explanation, Demonstration, Tactile cues, Verbal cues, and Handouts Education comprehension: verbalized understanding and returned demonstration  HOME EXERCISE PROGRAM: Access Code: [email or web address]: [email or web address] Date: 12/31/2022 Prepared by: MiDorcas Carrowxercises - Seated Shoulder Flexion AAROM with Pulley Behind - 1 x daily - 7 x weekly - 3 sets - 10 reps - Seated Shoulder Abduction AAROM with Pulley Behind - 1 x daily - 7 x weekly - 3 sets - 10 reps - Standing Shoulder Internal Rotation AAROM with Pulley - 1 x daily - 7 x weekly - 3 sets - 10 reps - Standing External Rotation in Abduction  AAROM with Pulley - 1 x daily - 7 x weekly - 3 sets - 10 reps - Supine Shoulder Flexion AAROM with Dowel - 1 x daily - 7 x weekly - 3 sets - 10 reps  01/07/2023: Access Code: [email or web address]: [email or web address] Date: 01/07/2023 Prepared by: AmJoaquin MusicExercises - Isometric Shoulder Flexion at Wall  - 1 x daily - 7 x weekly - 3 sets - 10 reps - 3secs hold - [place]ometric [place]er [place]-  1 x daily - 7 x weekly - 2 sets - 10 reps - 3secs hold - Isometric Shoulder External Rotation at Wall  - 1 x daily - 7 x weekly - 2 sets - 10 reps - 3secs hold - Isometric Shoulder Abduction at Wall  - 1 x daily - 7 x weekly - 2 sets - 10 reps - 3secs hold - Standing Isometric Shoulder Internal Rotation at Doorway  - 1 x daily - 7 x weekly - 2 sets - 10 reps - 3 secs hold ASSESSMENT:  CLINICAL IMPRESSION: Patient arrived to physical therapy of tightness in B shldr L >R who is motivated. PT assessed the shldr and found Post inferior capsule tightness in L shoulder and Laxity in R GH joint. Pt also demonstrates rounded shoulder and clicking with PROM in R shldr. Pt introduced to Isometrics ex to improve joint integrity and provided pt with updated HEP.  Pt tole Tx well and demonstrated good understanding of the HEP. Pt will benefit from ROM exs to L GH and Stabilization in R. Pt will benefit from continued PT interventions to address impairmetns and help pt achieve her goals.     OBJECTIVE IMPAIRMENTS: Abnormal gait, decreased activity tolerance, decreased endurance, decreased mobility, decreased ROM, decreased strength, hypomobility, impaired UE functional use, and pain.   ACTIVITY LIMITATIONS: carrying, lifting, dressing, reach over head, and hygiene/grooming  PARTICIPATION LIMITATIONS: cleaning, community activity, and yard work  PERSONAL FACTORS: Age, Past/current experiences, and Time since onset of injury/illness/exacerbation are also affecting patient's functional outcome.   REHAB  POTENTIAL: Good  CLINICAL DECISION MAKING: Evolving/moderate complexity  EVALUATION COMPLEXITY: Moderate  GOALS: Goals reviewed with patient Yes  SHORT TERM GOALS: Target date: 01/21/2023  Pt. Will be Independent with HEP to increase B shoulder flexion and abduction by 10 deg. To increase functional mobility of her UE's. Baseline: see above. Goal status: INITIAL   LONG TERM GOALS: Target date: 02/11/2023  Pt. Will increase FOTO to 62 to improve pain-free mobility.  Baseline: 51/62 Goal status: INITIAL  2.  Pt. Will report <3/10 B shoulder pain with overhead reaching/ lifting tasks.  Baseline: 10/10 NPS Goal status: INITIAL  3.  PT. Will improve B shoulder strength a 1/2 muscle grade in flexion, abduction, and middle trap to improve functional movement tasks such as putting away dishes and performing hair care.  Baseline: See above. Goal status: INITIAL   PLAN: PT FREQUENCY: 2x/week  PT DURATION: 6 weeks  PLANNED INTERVENTIONS: Therapeutic exercises, Therapeutic activity, Neuromuscular re-education, Gait training, Patient/Family education, Self Care, Joint mobilization, Dry Needling, Cryotherapy, Moist heat, Traction, Ultrasound, and Manual therapy  PLAN FOR NEXT SESSION:  Continue strengthening program, shoulder mobilizations for mobility  Joaquin Music PT DPT 4:13 PM,01/07/23

## 2023-01-09 ENCOUNTER — Ambulatory Visit: Payer: Medicare Other

## 2023-01-09 DIAGNOSIS — M25611 Stiffness of right shoulder, not elsewhere classified: Secondary | ICD-10-CM

## 2023-01-09 DIAGNOSIS — G8929 Other chronic pain: Secondary | ICD-10-CM

## 2023-01-09 DIAGNOSIS — M6281 Muscle weakness (generalized): Secondary | ICD-10-CM

## 2023-01-09 NOTE — Therapy (Signed)
OUTPATIENT PHYSICAL THERAPY UPPER EXTREMITY TREATMENT    Patient Name: Virginia Pollard MRN: [id number] DOB:[birth year], 77 y.o., female Today's Date: 01/09/2023  END OF SESSION:  PT End of Session - 01/09/23 1030     Visit Number 4    Number of Visits 13    Date for PT Re-Evaluation 02/11/23    PT Start Time 1032    PT Stop Time 1114    PT Time Calculation (min) 42 min    Activity Tolerance Patient tolerated treatment well    Behavior During Therapy WFL for tasks assessed/performed             Past Medical History:  Diagnosis Date   Arthritis    Hyperlipidemia    Hypertension    Past Surgical History:  Procedure Laterality Date   TUBAL LIGATION     There are no problems to display for this patient.   [facility] [place] [facility]   REFERRING PROVIDER: Anderson Malta, [facility] Provider   REFERRING DIAG: Acute pain of R shoulder    Bilateral shoulder impingment  THERAPY DIAG:  Shoulder joint stiffness, bilateral  Muscle weakness (generalized)  Chronic pain of both shoulders  Rationale for Evaluation and Treatment: Rehabilitation  ONSET DATE: Chronic, recently exacerbated the last 3/4 months   SUBJECTIVE:                                                                                                                                                                                      SUBJECTIVE STATEMENT: Pt. Has previous hx. Of L shoulder pain resolved with PT. Pt. Still has some stiffness in L shoulder. Pt. Reports R is c/c at this time and the pain has been severe(10/10 NPS at times). Pt. States she fell on the L shoulder 1 year ago which brought on the previous PT treatment that has since been resolved. Pt. States difficulty with any overhead activities and is limited in tasks such as hair care and reaching up to retrieve/put up dishes.  PERTINENT HISTORY: Pt. Has hx. Of chronic shoulder pain in both the R/L shoulder. Pt. States a  "black mark" was found on her lungs on a previous scan that she will be following up on with her PCP at the end of February.  Medication Sig Start Date End Date Taking? Authorizing Provider  aspirin EC 81 MG tablet Take by mouth. 03/09/18   Yes [provider]  baclofen (LIORESAL) 10 MG tablet Take 1 tablet (10 mg total) by mouth at bedtime as needed for muscle spasms. 08/08/21   Yes Danton Clap, PA-C  Cholecalciferol (VITAMIN D-1000 MAX ST) 25 MCG (1000  UT) tablet Take by mouth. 03/09/18   Yes [provider]  lisinopril (PRINIVIL,ZESTRIL) 10 MG tablet Take 10 mg by mouth daily.     Yes [provider]  metFORMIN (GLUCOPHAGE-XR) 500 MG 24 hr tablet Take 500 mg by mouth daily. 07/26/21   Yes [provider]  simvastatin (ZOCOR) 40 MG tablet Take 40 mg by mouth at bedtime. 07/26/21   Yes [provider]  triamcinolone ointment (KENALOG) 0.5 % Apply 1 application topically 2 (two) times daily. 08/08/21   Yes Danton Clap, PA-C  Biotin 1 MG CAPS Take by mouth.       [provider]  predniSONE (DELTASONE) 10 MG tablet Take 5 tabs PO on day 1 and decrease 1 tab daily until complete 08/08/21     Danton Clap, PA-C      Family History      Family History  Problem Relation Age of Onset   Hypertension Mother        Social History Social History        Tobacco Use   Smoking status: Never   Smokeless tobacco: Never  Vaping Use   Vaping Use: Never used  Substance Use Topics   Alcohol use: No   Drug use: No        Allergies              Patient has no known allergies.     Review of Systems Review of Systems  Constitutional: Negative.   Respiratory: Negative.    Cardiovascular: Negative.   Musculoskeletal:  Positive for myalgias. Negative for arthralgias, back pain, gait problem, joint swelling, neck pain and neck stiffness.  Skin: Negative.   Neurological: Negative.    PAIN:  Are you having pain? Yes L: 5/10 NPS R: 7/10 NPS   Pain location: B upper middle shoulder  Pain description: sharp with initiation of activity, constant dull ache Aggravating factors: reaching overhead Relieving factors: pullys, snow angels, shots, ibuprofen  Worst: 10/10 NPS Best: 4/10 NPS  PRECAUTIONS: None  WEIGHT BEARING RESTRICTIONS: No  FALLS:  Has patient fallen in last 6 months? Yes. Number of falls 1  LIVING ENVIRONMENT: Lives with: lives alone Lives in: House/apartment Stairs: No Has following equipment at home: None  OCCUPATION: Retired. Active in the church.   PLOF: Independent  PATIENT GOALS: Pt. Wants to get back to working in her yard, traveling, and live a full life without pain.   NEXT MD VISIT: 01/30/2023  OBJECTIVE:   DIAGNOSTIC FINDINGS:  See MD note  PATIENT SURVEYS :  FOTO initial 51/ goal 1  COGNITION: Overall cognitive status: Within functional limits for tasks assessed     SENSATION: WFL  POSTURE: WNL  UPPER EXTREMITY ROM:   Active ROM Right eval Left eval  Shoulder flexion 142 deg. 134 deg.  Shoulder extension [facility]  Shoulder abduction 76 deg. 79 deg.  Shoulder adduction    Shoulder internal rotation 70 deg. 51 deg.  Shoulder external rotation 69 deg. 39 deg.  Elbow flexion [facility] WFL  Elbow extension [facility]  Wrist flexion [facility] WFL  Wrist extension [facility] WFL  Wrist ulnar deviation    Wrist radial deviation    Wrist pronation    Wrist supination    (Blank rows = not tested)  Seated shoulder flexion: L: 93 deg./  R: 122 deg.  UPPER EXTREMITY MMT:  MMT Right eval Left eval  Shoulder flexion 4-/5 4-/5  Shoulder extension    Shoulder abduction  4/5 4/5  Shoulder adduction    Shoulder internal rotation 5/5 5/5  Shoulder external rotation 4-/5 4-/5  Middle trapezius 3+/5 3+/5  Lower trapezius    Elbow flexion 4/5 4/5  Elbow extension 4/5 4/5  Wrist flexion    Wrist extension    Wrist ulnar deviation    Wrist radial deviation    Wrist pronation    Wrist  supination    Grip strength (lbs)    (Blank rows = not tested)  JOINT MOBILITY TESTING:  PT performed B :  P-A- hypomobile A-P- hypomobile Inf. Glide- hypomobile  PALPATION:  PT. Noted some tenderness to palpation around both the R and L glenohumeral joint line.    TODAY'S TREATMENT:                                                                                                                                         DATE: 01/09/2023  Subjective: Pt. Arrived to PT reporting 5/10  L shoulder pain and 7/10 R shoulder pain on NPS. Pt. Reports some soreness after last treatment but no significant increase in pain. Pt. Reports compliance with HEP with no concerns. Pt. Has no increase in pain with HEP or UE bike.   There Ex.: 36mn -Biodex UBE bike 4 min. Fwd/ 4 min. Bkwd  -Pec stretches against wall with 2x60 sec. Holds each side.   -Standing ER 2x10 reps each side. PT tried YTB at first and regressed to un-weighted.   -Standing scapular retraction with GTB. 3x15 reps to increase muscular endurance.  -Standing B Biceps with 2# wt.'s in front of mirror for feedback on technique. PT cued to keep elbows tucked and have slow eccentric contraction. 2x10 reps.   -Standing B Triceps with YTB. 2x10 reps. Pt. Cued to fully extend elbow to strengthen through the entire range.   Manual Therapy: 19m -B shoulder mobilizations: inf. / A-P glides. 3x30 sec bouts of oscillation each. Pt. Had some reports of tenderness to shoulder and PT adjusted to a pain free position.    PATIENT EDUCATION: Education details: Pt. Educated on strength and mobility deficits found during PT evaluation. Pt. Educated on prognosis, treatment POC, and frequency of treatment. PT demonstrated HEP.  Person educated: Patient Education method: Explanation, Demonstration, Tactile cues, Verbal cues, and Handouts Education comprehension: verbalized understanding and returned demonstration  HOME EXERCISE PROGRAM: Access Code:  [email or web address]: [email or web address] Date: 12/31/2022 Prepared by: Virginia Carrowxercises - Seated Shoulder Flexion AAROM with Pulley Behind - 1 x daily - 7 x weekly - 3 sets - 10 reps - Seated Shoulder Abduction AAROM with Pulley Behind - 1 x daily - 7 x weekly - 3 sets - 10 reps - Standing Shoulder Internal Rotation AAROM with Pulley - 1 x daily - 7 x weekly - 3 sets - 10 reps - Standing External Rotation in Abduction AAROM with Pulley - 1 x daily -  7 x weekly - 3 sets - 10 reps - Supine Shoulder Flexion AAROM with Dowel - 1 x daily - 7 x weekly - 3 sets - 10 reps  01/07/2023: Access Code: [email or web address] URL: [email or web address] Date: 01/07/2023 Prepared by: Virginia Pollard  Exercises - Isometric Shoulder Flexion at Wall  - 1 x daily - 7 x weekly - 3 sets - 10 reps - 3secs hold - Isometric Shoulder Extension at Wall  - 1 x daily - 7 x weekly - 2 sets - 10 reps - 3secs hold - Isometric Shoulder External Rotation at Wall  - 1 x daily - 7 x weekly - 2 sets - 10 reps - 3secs hold - Isometric Shoulder Abduction at Wall  - 1 x daily - 7 x weekly - 2 sets - 10 reps - 3secs hold - Standing Isometric Shoulder Internal Rotation at Doorway  - 1 x daily - 7 x weekly - 2 sets - 10 reps - 3 secs hold ASSESSMENT:  CLINICAL IMPRESSION: Patient arrived to physical therapy reporting B shoulder pain of 5/10 on NPS in the L and 7/10 on NPS in the R. PT performed B inf. / A-P glides on the [facility] joint to reduce tightness/soreness following the previous PT session. Pt. Reported some tenderness under PT's hand during manual therapy and PT adjusted to a pain free position. Pt. Demonstrates rounded shoulders and increased tightness in pec major/minor. PT had the pt. perform pec stretches to open up the ant. Shoulder for a more efficient pull of the rotator cuff muscles on the shoulder joint. Pt. Continues to show significant weakness in B shoulder ER. PT attempted shoulder ER strengthening with YTB but  regressed to un-weighted due to pt. Being unable to complete the full motion effectively without compensation. PT Cued pt. During there ex. To keep her elbows tucked and keep a slow and controlled pace to ensure proper muscle contraction/isolation. PT increased reps during scapular retraction to help build endurance. Pt. Tolerated treatment well and and no increase in B shoulder pain from baseline. Pt. Will benefit from continued skilled PT treatment to continue increasing B shoulder range and strength to allow patient to complete pain-free functional movements.   OBJECTIVE IMPAIRMENTS: Abnormal gait, decreased activity tolerance, decreased endurance, decreased mobility, decreased ROM, decreased strength, hypomobility, impaired UE functional use, and pain.   ACTIVITY LIMITATIONS: carrying, lifting, dressing, reach over head, and hygiene/grooming  PARTICIPATION LIMITATIONS: cleaning, community activity, and yard work  PERSONAL FACTORS: Age, Past/current experiences, and Time since onset of injury/illness/exacerbation are also affecting patient's functional outcome.   REHAB POTENTIAL: Good  CLINICAL DECISION MAKING: Evolving/moderate complexity  EVALUATION COMPLEXITY: Moderate  GOALS: Goals reviewed with patient Yes  SHORT TERM GOALS: Target date: 01/21/2023  Pt. Will be Independent with HEP to increase B shoulder flexion and abduction by 10 deg. To increase functional mobility of her UE's. Baseline: see above. Goal status: INITIAL   LONG TERM GOALS: Target date: 02/11/2023  Pt. Will increase FOTO to 62 to improve pain-free mobility.  Baseline: 51/62 Goal status: INITIAL  2.  Pt. Will report <3/10 B shoulder pain with overhead reaching/ lifting tasks.  Baseline: 10/10 NPS Goal status: INITIAL  3.  PT. Will improve B shoulder strength a 1/2 muscle grade in flexion, abduction, and middle trap to improve functional movement tasks such as putting away dishes and performing hair care.   Baseline: See above. Goal status: INITIAL   PLAN: PT FREQUENCY: 2x/week  PT DURATION: 6 weeks  PLANNED INTERVENTIONS:  Therapeutic exercises, Therapeutic activity, Neuromuscular re-education, Gait training, Patient/Family education, Self Care, Joint mobilization, Dry Needling, Cryotherapy, Moist heat, Traction, Ultrasound, and Manual therapy  PLAN FOR NEXT SESSION:  Continue strengthening program, shoulder mobilizations for mobility  Ami Pathak PT DPT 1:00 PM,01/09/23 Zimere Dunlevy B. Rogers Blocker, SPT

## 2023-01-14 ENCOUNTER — Ambulatory Visit: Payer: Medicare Other | Admitting: Physical Therapy

## 2023-01-14 DIAGNOSIS — M6281 Muscle weakness (generalized): Secondary | ICD-10-CM

## 2023-01-14 DIAGNOSIS — M25611 Stiffness of right shoulder, not elsewhere classified: Secondary | ICD-10-CM

## 2023-01-14 DIAGNOSIS — G8929 Other chronic pain: Secondary | ICD-10-CM

## 2023-01-14 NOTE — Therapy (Unsigned)
OUTPATIENT PHYSICAL THERAPY UPPER EXTREMITY TREATMENT    Patient Name: Virginia Pollard MRN: [id number] DOB:[birth year], 77 y.o., female Today's Date: 01/14/2023  END OF SESSION:  PT End of Session - 01/14/23 1432     Visit Number 5    Number of Visits 13    Date for PT Re-Evaluation 02/11/23    PT Start Time [id number]    PT Stop Time [id number]    PT Time Calculation (min) 45 min    Activity Tolerance Patient tolerated treatment well;No increased pain    Behavior During Therapy WFL for tasks assessed/performed             Past Medical History:  Diagnosis Date   Arthritis    Hyperlipidemia    Hypertension    Past Surgical History:  Procedure Laterality Date   TUBAL LIGATION     There are no problems to display for this patient.   PCP: [facility] [place] [facility] - General   REFERRING PROVIDER: Anderson Malta, [facility] Provider   REFERRING DIAG: Acute pain of R shoulder    Bilateral shoulder impingment  THERAPY DIAG:  Shoulder joint stiffness, bilateral  Muscle weakness (generalized)  Chronic pain of both shoulders  Rationale for Evaluation and Treatment: Rehabilitation  ONSET DATE: Chronic, recently exacerbated the last 3/4 months   SUBJECTIVE:                                                                                                                                                                                      SUBJECTIVE STATEMENT: Pt. Has previous hx. Of L shoulder pain resolved with PT. Pt. Still has some stiffness in L shoulder. Pt. Reports R is c/c at this time and the pain has been severe(10/10 NPS at times). Pt. States she fell on the L shoulder 1 year ago which brought on the previous PT treatment that has since been resolved. Pt. States difficulty with any overhead activities and is limited in tasks such as hair care and reaching up to retrieve/put up dishes.  PERTINENT HISTORY: Pt. Has hx. Of chronic shoulder pain in both the R/L  shoulder. Pt. States a "black mark" was found on her lungs on a previous scan that she will be following up on with her PCP at the end of February.  Medication Sig Start Date End Date Taking? Authorizing Provider  aspirin EC 81 MG tablet Take by mouth. 03/09/18   Yes [provider]  baclofen (LIORESAL) 10 MG tablet Take 1 tablet (10 mg total) by mouth at bedtime as needed for muscle spasms. 08/08/21   Yes Danton Clap, PA-C  Cholecalciferol (VITAMIN D-1000 MAX ST) 25  MCG (1000 UT) tablet Take by mouth. 03/09/18   Yes [provider]  lisinopril (PRINIVIL,ZESTRIL) 10 MG tablet Take 10 mg by mouth daily.     Yes [provider]  metFORMIN (GLUCOPHAGE-XR) 500 MG 24 hr tablet Take 500 mg by mouth daily. 07/26/21   Yes [provider]  simvastatin (ZOCOR) 40 MG tablet Take 40 mg by mouth at bedtime. 07/26/21   Yes [provider]  triamcinolone ointment (KENALOG) 0.5 % Apply 1 application topically 2 (two) times daily. 08/08/21   Yes Danton Clap, PA-C  Biotin 1 MG CAPS Take by mouth.       [provider]  predniSONE (DELTASONE) 10 MG tablet Take 5 tabs PO on day 1 and decrease 1 tab daily until complete 08/08/21     Danton Clap, PA-C      Family History      Family History  Problem Relation Age of Onset   Hypertension Mother        Social History Social History        Tobacco Use   Smoking status: Never   Smokeless tobacco: Never  Vaping Use   Vaping Use: Never used  Substance Use Topics   Alcohol use: No   Drug use: No        Allergies              Patient has no known allergies.     Review of Systems Review of Systems  Constitutional: Negative.   Respiratory: Negative.    Cardiovascular: Negative.   Musculoskeletal:  Positive for myalgias. Negative for arthralgias, back pain, gait problem, joint swelling, neck pain and neck stiffness.  Skin: Negative.   Neurological: Negative.    PAIN:  Are you having pain? Yes L:  5/10 NPS R: 7/10 NPS  Pain location: B upper middle shoulder  Pain description: sharp with initiation of activity, constant dull ache Aggravating factors: reaching overhead Relieving factors: pullys, snow angels, shots, ibuprofen  Worst: 10/10 NPS Best: 4/10 NPS  PRECAUTIONS: None  WEIGHT BEARING RESTRICTIONS: No  FALLS:  Has patient fallen in last 6 months? Yes. Number of falls 1  LIVING ENVIRONMENT: Lives with: lives alone Lives in: House/apartment Stairs: No Has following equipment at home: None  OCCUPATION: Retired. Active in the church.   PLOF: Independent  PATIENT GOALS: Pt. Wants to get back to working in her yard, traveling, and live a full life without pain.   NEXT MD VISIT: 01/30/2023  OBJECTIVE:   DIAGNOSTIC FINDINGS:  See MD note  PATIENT SURVEYS :  FOTO initial 51/ goal 66  COGNITION: Overall cognitive status: Within functional limits for tasks assessed     SENSATION: WFL  POSTURE: WNL  UPPER EXTREMITY ROM:   Active ROM Right eval Left eval  Shoulder flexion 142 deg. 134 deg.  Shoulder extension [facility]  Shoulder abduction 76 deg. 79 deg.  Shoulder adduction    Shoulder internal rotation 70 deg. 51 deg.  Shoulder external rotation 69 deg. 39 deg.  Elbow flexion [facility] WFL  Elbow extension [facility]  Wrist flexion [facility] WFL  Wrist extension [facility] WFL  Wrist ulnar deviation    Wrist radial deviation    Wrist pronation    Wrist supination    (Blank rows = not tested)  Seated shoulder flexion: L: 93 deg./  R: 122 deg.  UPPER EXTREMITY MMT:  MMT Right eval Left eval  Shoulder flexion 4-/5 4-/5  Shoulder extension  Shoulder abduction 4/5 4/5  Shoulder adduction    Shoulder internal rotation 5/5 5/5  Shoulder external rotation 4-/5 4-/5  Middle trapezius 3+/5 3+/5  Lower trapezius    Elbow flexion 4/5 4/5  Elbow extension 4/5 4/5  Wrist flexion    Wrist extension    Wrist ulnar deviation    Wrist radial deviation    Wrist  pronation    Wrist supination    Grip strength (lbs)    (Blank rows = not tested)  JOINT MOBILITY TESTING:  PT performed B :  P-A- hypomobile A-P- hypomobile Inf. Glide- hypomobile  PALPATION:  PT. Noted some tenderness to palpation around both the R and L glenohumeral joint line.    TODAY'S TREATMENT:                                                                                                                                         DATE: 01/14/2023  Subjective: Pt. Arrived to PT reporting 6/10  L shoulder pain and 8/10 R shoulder pain on NPS. Pt. Reports some soreness after last treatment but no significant increase in sx. Pt. Reports compliance with HEP with no concerns. Pt. Reports increase in pain more than normal today due to the weather.   There Ex.:  -Biodex UBE bike 4 min. Fwd/ 4 min. Bkwd  -B Standing wall ladder 3x (up/down)   -B supine serratus punches un weighted. 1x10 each side.   -Supine B Isometric resistance to all planes. IR / ER / Flex. / Abd. 1x10 B each direction with mod. Resistance.   -B Standing YTB IR 1x10 reps. Pt. Cued to tuck elbow and isolate RC muscles.   -Standing un weighted ER 2x10 reps each side.    -Standing scapular retraction with GTB. 3x15 reps to increase muscular endurance.   PATIENT EDUCATION: Education details: Pt. Educated on strength and mobility deficits found during PT evaluation. Pt. Educated on prognosis, treatment POC, and frequency of treatment. PT demonstrated HEP.  Person educated: Patient Education method: Explanation, Demonstration, Tactile cues, Verbal cues, and Handouts Education comprehension: verbalized understanding and returned demonstration  HOME EXERCISE PROGRAM: Access Code: [email or web address] URL: [email or web address] Date: 12/31/2022 Prepared by: Dorcas Carrow Exercises - Seated Shoulder Flexion AAROM with Pulley Behind - 1 x daily - 7 x weekly - 3 sets - 10 reps - Seated Shoulder Abduction AAROM with  Pulley Behind - 1 x daily - 7 x weekly - 3 sets - 10 reps - Standing Shoulder Internal Rotation AAROM with Pulley - 1 x daily - 7 x weekly - 3 sets - 10 reps - Standing External Rotation in Abduction AAROM with Pulley - 1 x daily - 7 x weekly - 3 sets - 10 reps - Supine Shoulder Flexion AAROM with Dowel - 1 x daily - 7 x weekly - 3 sets - 10 reps  01/07/2023: Access Code: [email or web address] URL: [email or web address] Date: 01/07/2023  Prepared by: Joaquin Music  Exercises - Isometric Shoulder Flexion at Wall  - 1 x daily - 7 x weekly - 3 sets - 10 reps - 3secs hold - Isometric Shoulder Extension at Wall  - 1 x daily - 7 x weekly - 2 sets - 10 reps - 3secs hold - Isometric Shoulder External Rotation at Wall  - 1 x daily - 7 x weekly - 2 sets - 10 reps - 3secs hold - Isometric Shoulder Abduction at Wall  - 1 x daily - 7 x weekly - 2 sets - 10 reps - 3secs hold - Standing Isometric Shoulder Internal Rotation at Doorway  - 1 x daily - 7 x weekly - 2 sets - 10 reps - 3 secs hold ASSESSMENT:  CLINICAL IMPRESSION: Patient arrived to physical therapy reporting an increase in B shoulder pain of 6/10 on NPS in the L and 8/10 on NPS in the R. Pt. Has more pain than usual due to the weather. PT performed 4-plane isometric resistance to pt. To increase RC strength while avoiding excess stress on the joint. Pt. Continues to demonstrate limited B shoulder motion in all planes. Pt. Has considerable weakness of rotator cuff in all directions (flex, abd, IR, ER) but most especially ER. PT adjusted serratus punches to 70 deg. Of shoulder flexion to prevent pain during there ex. Activity and strengthen in a limited/pain-free range. PT Cued pt. During there ex. To keep her elbows tucked and keep a slow and controlled pace to ensure proper muscle contraction/isolation. PT increased reps during scapular retraction to help build endurance. Pt. Had no concerns regarding HEP and no increase in B shoulder pain from baseline. Pt.  Will benefit from continued skilled PT treatment to continue increasing B shoulder range and strength to allow patient to complete pain-free functional movements.   OBJECTIVE IMPAIRMENTS: Abnormal gait, decreased activity tolerance, decreased endurance, decreased mobility, decreased ROM, decreased strength, hypomobility, impaired UE functional use, and pain.   ACTIVITY LIMITATIONS: carrying, lifting, dressing, reach over head, and hygiene/grooming  PARTICIPATION LIMITATIONS: cleaning, community activity, and yard work  PERSONAL FACTORS: Age, Past/current experiences, and Time since onset of injury/illness/exacerbation are also affecting patient's functional outcome.   REHAB POTENTIAL: Good  CLINICAL DECISION MAKING: Evolving/moderate complexity  EVALUATION COMPLEXITY: Moderate  GOALS: Goals reviewed with patient Yes  SHORT TERM GOALS: Target date: 01/21/2023  Pt. Will be Independent with HEP to increase B shoulder flexion and abduction by 10 deg. To increase functional mobility of her UE's. Baseline: see above. Goal status: INITIAL   LONG TERM GOALS: Target date: 02/11/2023  Pt. Will increase FOTO to 62 to improve pain-free mobility.  Baseline: 51/62 Goal status: INITIAL  2.  Pt. Will report <3/10 B shoulder pain with overhead reaching/ lifting tasks.  Baseline: 10/10 NPS Goal status: INITIAL  3.  PT. Will improve B shoulder strength a 1/2 muscle grade in flexion, abduction, and middle trap to improve functional movement tasks such as putting away dishes and performing hair care.  Baseline: See above. Goal status: INITIAL   PLAN: PT FREQUENCY: 2x/week  PT DURATION: 6 weeks  PLANNED INTERVENTIONS: Therapeutic exercises, Therapeutic activity, Neuromuscular re-education, Gait training, Patient/Family education, Self Care, Joint mobilization, Dry Needling, Cryotherapy, Moist heat, Traction, Ultrasound, and Manual therapy  PLAN FOR NEXT SESSION:  Continue strengthening  program, shoulder mobilizations for mobility  Pura Spice, PT, DPT # [phone] Hayleigh Bawa B. Rogers Blocker, SPT

## 2023-01-16 ENCOUNTER — Encounter: Payer: Self-pay | Admitting: Physical Therapy

## 2023-01-16 ENCOUNTER — Ambulatory Visit: Payer: Medicare Other | Admitting: Physical Therapy

## 2023-01-16 DIAGNOSIS — M25611 Stiffness of right shoulder, not elsewhere classified: Secondary | ICD-10-CM

## 2023-01-16 DIAGNOSIS — G8929 Other chronic pain: Secondary | ICD-10-CM

## 2023-01-16 DIAGNOSIS — M6281 Muscle weakness (generalized): Secondary | ICD-10-CM

## 2023-01-16 NOTE — Therapy (Signed)
OUTPATIENT PHYSICAL THERAPY UPPER EXTREMITY TREATMENT    Patient Name: Virginia Pollard MRN: [id number] DOB:[birth year], 77 y.o., female Today's Date: 01/16/2023  END OF SESSION:  PT End of Session - 01/16/23 1435     Visit Number 6    Number of Visits 13    Date for PT Re-Evaluation 02/11/23    PT Start Time 1435    PT Stop Time [id number]    PT Time Calculation (min) 43 min    Activity Tolerance Patient tolerated treatment well;No increased pain    Behavior During Therapy WFL for tasks assessed/performed             Past Medical History:  Diagnosis Date   Arthritis    Hyperlipidemia    Hypertension    Past Surgical History:  Procedure Laterality Date   TUBAL LIGATION     There are no problems to display for this patient.   [facility]P: [facility]   REFERRING PROVIDER: Anderson Malta, [facility] Provider   REFERRING DIAG: Acute pain of R shoulder    Bilateral shoulder impingment  THERAPY DIAG:  Shoulder joint stiffness, bilateral  Muscle weakness (generalized)  Chronic pain of both shoulders  Rationale for Evaluation and Treatment: Rehabilitation  ONSET DATE: Chronic, recently exacerbated the last 3/4 months   SUBJECTIVE:                                                                                                                                                                                      SUBJECTIVE STATEMENT: Pt. Has previous hx. Of L shoulder pain resolved with PT. Pt. Still has some stiffness in L shoulder. Pt. Reports R is c/c at this time and the pain has been severe(10/10 NPS at times). Pt. States she fell on the L shoulder 1 year ago which brought on the previous PT treatment that has since been resolved. Pt. States difficulty with any overhead activities and is limited in tasks such as hair care and reaching up to retrieve/put up dishes.  PERTINENT HISTORY: Pt. Has hx. Of chronic shoulder pain in both the R/L  shoulder. Pt. States a "black mark" was found on her lungs on a previous scan that she will be following up on with her PCP at the end of February.  Medication Sig Start Date End Date Taking? Authorizing Provider  aspirin EC 81 MG tablet Take by mouth. 03/09/18   Yes [provider]  baclofen (LIORESAL) 10 MG tablet Take 1 tablet (10 mg total) by mouth at bedtime as needed for muscle spasms. 08/08/21   Yes Danton Clap, PA-C  Cholecalciferol (VITAMIN D-1000 MAX ST) 25  MCG (1000 UT) tablet Take by mouth. 03/09/18   Yes [provider]  lisinopril (PRINIVIL,ZESTRIL) 10 MG tablet Take 10 mg by mouth daily.     Yes [provider]  metFORMIN (GLUCOPHAGE-XR) 500 MG 24 hr tablet Take 500 mg by mouth daily. 07/26/21   Yes [provider]  simvastatin (ZOCOR) 40 MG tablet Take 40 mg by mouth at bedtime. 07/26/21   Yes [provider]  triamcinolone ointment (KENALOG) 0.5 % Apply 1 application topically 2 (two) times daily. 08/08/21   Yes Danton Clap, PA-C  Biotin 1 MG CAPS Take by mouth.       [provider]  predniSONE (DELTASONE) 10 MG tablet Take 5 tabs PO on day 1 and decrease 1 tab daily until complete 08/08/21     Danton Clap, PA-C      Family History      Family History  Problem Relation Age of Onset   Hypertension Mother        Social History Social History        Tobacco Use   Smoking status: Never   Smokeless tobacco: Never  Vaping Use   Vaping Use: Never used  Substance Use Topics   Alcohol use: No   Drug use: No        Allergies              Patient has no known allergies.     Review of Systems Review of Systems  Constitutional: Negative.   Respiratory: Negative.    Cardiovascular: Negative.   Musculoskeletal:  Positive for myalgias. Negative for arthralgias, back pain, gait problem, joint swelling, neck pain and neck stiffness.  Skin: Negative.   Neurological: Negative.    PAIN:  Are you having pain? Yes L:  5/10 NPS R: 7/10 NPS  Pain location: B upper middle shoulder  Pain description: sharp with initiation of activity, constant dull ache Aggravating factors: reaching overhead Relieving factors: pullys, snow angels, shots, ibuprofen  Worst: 10/10 NPS Best: 4/10 NPS  PRECAUTIONS: None  WEIGHT BEARING RESTRICTIONS: No  FALLS:  Has patient fallen in last 6 months? Yes. Number of falls 1  LIVING ENVIRONMENT: Lives with: lives alone Lives in: House/apartment Stairs: No Has following equipment at home: None  OCCUPATION: Retired. Active in the church.   PLOF: Independent  PATIENT GOALS: Pt. Wants to get back to working in her yard, traveling, and live a full life without pain.   NEXT MD VISIT: 01/30/2023  OBJECTIVE:   DIAGNOSTIC FINDINGS:  See MD note  PATIENT SURVEYS :  FOTO initial 51/ goal 58  COGNITION: Overall cognitive status: Within functional limits for tasks assessed     SENSATION: WFL  POSTURE: WNL  UPPER EXTREMITY ROM:   Active ROM Right eval Left eval  Shoulder flexion 142 deg. 134 deg.  Shoulder extension [facility]  Shoulder abduction 76 deg. 79 deg.  Shoulder adduction    Shoulder internal rotation 70 deg. 51 deg.  Shoulder external rotation 69 deg. 39 deg.  Elbow flexion [facility] WFL  Elbow extension [facility]  Wrist flexion [facility] WFL  Wrist extension [facility] WFL  Wrist ulnar deviation    Wrist radial deviation    Wrist pronation    Wrist supination    (Blank rows = not tested)  Seated shoulder flexion: L: 93 deg./  R: 122 deg.  UPPER EXTREMITY MMT:  MMT Right eval Left eval  Shoulder flexion 4-/5 4-/5  Shoulder extension  Shoulder abduction 4/5 4/5  Shoulder adduction    Shoulder internal rotation 5/5 5/5  Shoulder external rotation 4-/5 4-/5  Middle trapezius 3+/5 3+/5  Lower trapezius    Elbow flexion 4/5 4/5  Elbow extension 4/5 4/5  Wrist flexion    Wrist extension    Wrist ulnar deviation    Wrist radial deviation    Wrist  pronation    Wrist supination    Grip strength (lbs)    (Blank rows = not tested)  JOINT MOBILITY TESTING:  PT performed B :  P-A- hypomobile A-P- hypomobile Inf. Glide- hypomobile  PALPATION:  PT. Noted some tenderness to palpation around both the R and L glenohumeral joint line.    TODAY'S TREATMENT:                                                                                                                                         DATE: 01/16/2023  Subjective: Pt. Arrived to PT reporting 7/10  L shoulder pain and 8/10 R shoulder pain on NPS. Pt. Reports decrease in sx. After last treatment that lasted for a short period. Pt. Reports compliance with HEP with no concerns.   There Ex.:  -Biodex UBE bike 3 min. Fwd/ 3 min. Bkwd  -Seated arm pulleys: Flex./Abd. 2x60 sec. Sets each.   -B supine serratus punches un weighted. 1x15 each side.   -Supine B Isometric resistance to all planes. IR / ER / Flex. / Abd. 1x10 each direction with mod. Resistance. 3 sec. Hold during each rep.    -Standing scapular retraction with GTB. 3x15 reps to increase muscular endurance.    -Standing Tricep ext. With GTB. 2x10 reps.   -Standing bicep curls with 4# dumbbells. PT. Cued to allow slow eccentric contraction of bicep.     PATIENT EDUCATION: Education details: Pt. Educated on strength and mobility deficits found during PT evaluation. Pt. Educated on prognosis, treatment POC, and frequency of treatment. PT demonstrated HEP.  Person educated: Patient Education method: Explanation, Demonstration, Tactile cues, Verbal cues, and Handouts Education comprehension: verbalized understanding and returned demonstration  HOME EXERCISE PROGRAM: Access Code: [email or web address] URL: [email or web address] Date: 12/31/2022 Prepared by: Dorcas Carrow Exercises - Seated Shoulder Flexion AAROM with Pulley Behind - 1 x daily - 7 x weekly - 3 sets - 10 reps - Seated Shoulder Abduction AAROM with Pulley Behind  - 1 x daily - 7 x weekly - 3 sets - 10 reps - Standing Shoulder Internal Rotation AAROM with Pulley - 1 x daily - 7 x weekly - 3 sets - 10 reps - Standing External Rotation in Abduction AAROM with Pulley - 1 x daily - 7 x weekly - 3 sets - 10 reps - Supine Shoulder Flexion AAROM with Dowel - 1 x daily - 7 x weekly - 3 sets - 10 reps  01/07/2023: Access Code: [email or web address] URL: [email or web address] Date: 01/07/2023 Prepared by: Rae Mar  Pathak  Exercises - Isometric Shoulder Flexion at Wall  - 1 x daily - 7 x weekly - 3 sets - 10 reps - 3secs hold - Isometric Shoulder Extension at Wall  - 1 x daily - 7 x weekly - 2 sets - 10 reps - 3secs hold - Isometric Shoulder External Rotation at Wall  - 1 x daily - 7 x weekly - 2 sets - 10 reps - 3secs hold - Isometric Shoulder Abduction at Wall  - 1 x daily - 7 x weekly - 2 sets - 10 reps - 3secs hold - Standing Isometric Shoulder Internal Rotation at Doorway  - 1 x daily - 7 x weekly - 2 sets - 10 reps - 3 secs hold ASSESSMENT:  CLINICAL IMPRESSION: Patient arrived to physical therapy reporting B shoulder pain of 7/10 on NPS in the L and 8/10 on NPS in the R. Pt. Had some short term improvement following the last treatment session. PT performed 4-plane isometric resistance to pt. To increase RC strength while avoiding excess stress on the joint. Pt. Continues to demonstrate limited B shoulder motion in all planes. Pt. Has considerable weakness of rotator cuff in all directions (flex, abd, IR, ER) but most especially ER/Flex. PT adjusted serratus punches to 70 deg. Of shoulder flexion to prevent pain during there ex. Activity and strengthen in a limited/pain-free range. PT Cued pt. During there ex. To keep her elbows tucked and keep a slow and controlled pace to ensure proper muscle contraction/isolation. PT increased reps during scapular retraction to help build endurance. Pt. Had no concerns regarding HEP and no increase in B shoulder pain from baseline.  Pt. Will benefit from continued skilled PT treatment to continue increasing B shoulder range and strength to allow patient to complete pain-free functional movements.   OBJECTIVE IMPAIRMENTS: Abnormal gait, decreased activity tolerance, decreased endurance, decreased mobility, decreased ROM, decreased strength, hypomobility, impaired UE functional use, and pain.   ACTIVITY LIMITATIONS: carrying, lifting, dressing, reach over head, and hygiene/grooming  PARTICIPATION LIMITATIONS: cleaning, community activity, and yard work  PERSONAL FACTORS: Age, Past/current experiences, and Time since onset of injury/illness/exacerbation are also affecting patient's functional outcome.   REHAB POTENTIAL: Good  CLINICAL DECISION MAKING: Evolving/moderate complexity  EVALUATION COMPLEXITY: Moderate  GOALS: Goals reviewed with patient Yes  SHORT TERM GOALS: Target date: 01/21/2023  Pt. Will be Independent with HEP to increase B shoulder flexion and abduction by 10 deg. To increase functional mobility of her UE's. Baseline: see above. Goal status: INITIAL   LONG TERM GOALS: Target date: 02/11/2023  Pt. Will increase FOTO to 62 to improve pain-free mobility.  Baseline: 51/62 Goal status: INITIAL  2.  Pt. Will report <3/10 B shoulder pain with overhead reaching/ lifting tasks.  Baseline: 10/10 NPS Goal status: INITIAL  3.  PT. Will improve B shoulder strength a 1/2 muscle grade in flexion, abduction, and middle trap to improve functional movement tasks such as putting away dishes and performing hair care.  Baseline: See above. Goal status: INITIAL   PLAN: PT FREQUENCY: 2x/week  PT DURATION: 6 weeks  PLANNED INTERVENTIONS: Therapeutic exercises, Therapeutic activity, Neuromuscular re-education, Gait training, Patient/Family education, Self Care, Joint mobilization, Dry Needling, Cryotherapy, Moist heat, Traction, Ultrasound, and Manual therapy  PLAN FOR NEXT SESSION:  Continue strengthening  program, shoulder mobilizations for mobility  Pura Spice, PT, DPT # [phone] Caleah Tortorelli B. Rogers Blocker, SPT

## 2023-01-21 ENCOUNTER — Encounter: Payer: Medicare Other | Admitting: Physical Therapy

## 2023-01-23 ENCOUNTER — Encounter: Payer: Self-pay | Admitting: Physical Therapy

## 2023-01-23 ENCOUNTER — Ambulatory Visit: Payer: Medicare Other | Attending: Orthopedic Surgery | Admitting: Physical Therapy

## 2023-01-23 DIAGNOSIS — M6281 Muscle weakness (generalized): Secondary | ICD-10-CM | POA: Diagnosis present

## 2023-01-23 DIAGNOSIS — R293 Abnormal posture: Secondary | ICD-10-CM | POA: Diagnosis present

## 2023-01-23 DIAGNOSIS — M25612 Stiffness of left shoulder, not elsewhere classified: Secondary | ICD-10-CM | POA: Insufficient documentation

## 2023-01-23 DIAGNOSIS — M25611 Stiffness of right shoulder, not elsewhere classified: Secondary | ICD-10-CM | POA: Diagnosis present

## 2023-01-23 DIAGNOSIS — M25512 Pain in left shoulder: Secondary | ICD-10-CM | POA: Insufficient documentation

## 2023-01-23 DIAGNOSIS — M25511 Pain in right shoulder: Secondary | ICD-10-CM | POA: Diagnosis present

## 2023-01-23 DIAGNOSIS — G8929 Other chronic pain: Secondary | ICD-10-CM | POA: Insufficient documentation

## 2023-01-23 NOTE — Therapy (Signed)
OUTPATIENT PHYSICAL THERAPY UPPER EXTREMITY TREATMENT    Patient Name: Virginia Pollard MRN: [id number] DOB:25-Apr-1946, 77 y.o., female Today's Date: 01/23/2023  END OF SESSION:  PT End of Session - 01/23/23 1436     Visit Number 7    Number of Visits 13    Date for PT Re-Evaluation 02/11/23    PT Start Time 1426    PT Stop Time 1511    PT Time Calculation (min) 45 min    Activity Tolerance Patient tolerated treatment well;No increased pain    Behavior During Therapy WFL for tasks assessed/performed             Past Medical History:  Diagnosis Date   Arthritis    Hyperlipidemia    Hypertension    Past Surgical History:  Procedure Laterality Date   TUBAL LIGATION     There are no problems to display for this patient.   PCP: [facility] - General   REFERRING PROVIDER: Anderson Malta, [facility] Provider   REFERRING DIAG: Acute pain of R shoulder    Bilateral shoulder impingment  THERAPY DIAG:  Shoulder joint stiffness, bilateral  Muscle weakness (generalized)  Chronic pain of both shoulders  Chronic left shoulder pain  Decreased range of motion of left shoulder  Abnormal posture  Rationale for Evaluation and Treatment: Rehabilitation  ONSET DATE: Chronic, recently exacerbated the last 3/4 months   SUBJECTIVE:                                                                                                                                                                                      SUBJECTIVE STATEMENT: Pt. Has previous hx. Of L shoulder pain resolved with PT. Pt. Still has some stiffness in L shoulder. Pt. Reports R is c/c at this time and the pain has been severe(10/10 NPS at times). Pt. States she fell on the L shoulder 1 year ago which brought on the previous PT treatment that has since been resolved. Pt. States difficulty with any overhead activities and is limited in tasks such as hair care and reaching up to retrieve/put  up dishes.  PERTINENT HISTORY: Pt. Has hx. Of chronic shoulder pain in both the R/L shoulder. Pt. States a "black mark" was found on her lungs on a previous scan that she will be following up on with her PCP at the end of February.  Medication Sig Start Date End Date Taking? Authorizing Provider  aspirin EC 81 MG tablet Take by mouth. 03/09/18   Yes [provider]  baclofen (LIORESAL) 10 MG tablet Take 1 tablet (10 mg total) by mouth at bedtime as needed for muscle  spasms. 08/08/21   Yes Danton Clap, PA-C  Cholecalciferol (VITAMIN D-1000 MAX ST) 25 MCG (1000 UT) tablet Take by mouth. 03/09/18   Yes [provider]  lisinopril (PRINIVIL,ZESTRIL) 10 MG tablet Take 10 mg by mouth daily.     Yes [provider]  metFORMIN (GLUCOPHAGE-XR) 500 MG 24 hr tablet Take 500 mg by mouth daily. 07/26/21   Yes [provider]  simvastatin (ZOCOR) 40 MG tablet Take 40 mg by mouth at bedtime. 07/26/21   Yes [provider]  triamcinolone ointment (KENALOG) 0.5 % Apply 1 application topically 2 (two) times daily. 08/08/21   Yes Danton Clap, PA-C  Biotin 1 MG CAPS Take by mouth.       [provider]  predniSONE (DELTASONE) 10 MG tablet Take 5 tabs PO on day 1 and decrease 1 tab daily until complete 08/08/21     Danton Clap, PA-C      Family History      Family History  Problem Relation Age of Onset   Hypertension Mother        Social History Social History        Tobacco Use   Smoking status: Never   Smokeless tobacco: Never  Vaping Use   Vaping Use: Never used  Substance Use Topics   Alcohol use: No   Drug use: No        Allergies              Patient has no known allergies.     Review of Systems Review of Systems  Constitutional: Negative.   Respiratory: Negative.    Cardiovascular: Negative.   Musculoskeletal:  Positive for myalgias. Negative for arthralgias, back pain, gait problem, joint swelling, neck pain and neck  stiffness.  Skin: Negative.   Neurological: Negative.    PAIN:  Are you having pain? Yes L: 5/10 NPS R: 7/10 NPS  Pain location: B upper middle shoulder  Pain description: sharp with initiation of activity, constant dull ache Aggravating factors: reaching overhead Relieving factors: pullys, snow angels, shots, ibuprofen  Worst: 10/10 NPS Best: 4/10 NPS  PRECAUTIONS: None  WEIGHT BEARING RESTRICTIONS: No  FALLS:  Has patient fallen in last 6 months? Yes. Number of falls 1  LIVING ENVIRONMENT: Lives with: lives alone Lives in: House/apartment Stairs: No Has following equipment at home: None  OCCUPATION: Retired. Active in the church.   PLOF: Independent  PATIENT GOALS: Pt. Wants to get back to working in her yard, traveling, and live a full life without pain.   NEXT MD VISIT: 01/30/2023  OBJECTIVE:   DIAGNOSTIC FINDINGS:  See MD note  PATIENT SURVEYS :  FOTO initial 51/ goal 56  COGNITION: Overall cognitive status: Within functional limits for tasks assessed     SENSATION: WFL  POSTURE: WNL  UPPER EXTREMITY ROM:   Active ROM Right eval Left eval  Shoulder flexion 142 deg. 134 deg.  Shoulder extension [facility]  Shoulder abduction 76 deg. 79 deg.  Shoulder adduction    Shoulder internal rotation 70 deg. 51 deg.  Shoulder external rotation 69 deg. 39 deg.  Elbow flexion [facility] WFL  Elbow extension [facility]  Wrist flexion [facility] WFL  Wrist extension [facility] WFL  Wrist ulnar deviation    Wrist radial deviation    Wrist pronation    Wrist supination    (Blank rows = not tested)  Seated shoulder flexion: L: 93 deg./  R: 122 deg.  UPPER EXTREMITY MMT:  MMT Right eval Left eval  Shoulder flexion 4-/5 4-/5  Shoulder extension    Shoulder abduction 4/5 4/5  Shoulder adduction    Shoulder internal rotation 5/5 5/5  Shoulder external rotation 4-/5 4-/5  Middle trapezius 3+/5 3+/5  Lower trapezius    Elbow flexion 4/5 4/5  Elbow extension 4/5 4/5  Wrist  flexion    Wrist extension    Wrist ulnar deviation    Wrist radial deviation    Wrist pronation    Wrist supination    Grip strength (lbs)    (Blank rows = not tested)  JOINT MOBILITY TESTING:  PT performed B :  P-A- hypomobile A-P- hypomobile Inf. Glide- hypomobile  PALPATION:  PT. Noted some tenderness to palpation around both the R and L glenohumeral joint line.    TODAY'S TREATMENT:                                                                                                                                         DATE: 01/23/2023  Subjective: Pt. Arrived to PT reporting no L or R shoulder pain at rest.  Marked increase in pain with overhead reaching.  Pt. Reports compliance with HEP with no concerns.   There Ex.:  -Biodex UBE bike 3 min. Fwd/ 3 min. Bkwd  -Seated B wand ex. (AA/PROM)- with PT assist 10x all planes.    -B supine serratus punches un weighted. 1x15 each side.     -Standing scapular retraction/tricep ext./bicep curls with GTB. 2x15 reps to increase muscular endurance.    -Supine B Isometric resistance to all planes. IR/ ER/ Abd. 10x each direction with mod. Resistance. 3 sec. Hold during each rep.  Supine serratus punches 10x on L/R.     PATIENT EDUCATION: Education details: Pt. Educated on strength and mobility deficits found during PT evaluation. Pt. Educated on prognosis, treatment POC, and frequency of treatment. PT demonstrated HEP.  Person educated: Patient Education method: Explanation, Demonstration, Tactile cues, Verbal cues, and Handouts Education comprehension: verbalized understanding and returned demonstration  HOME EXERCISE PROGRAM: Access Code: [email or web address] URL: [email or web address] Date: 12/31/2022 Prepared by: Dorcas Carrow Exercises - Seated Shoulder Flexion AAROM with Pulley Behind - 1 x daily - 7 x weekly - 3 sets - 10 reps - Seated Shoulder Abduction AAROM with Pulley Behind - 1 x daily - 7 x weekly - 3 sets - 10 reps -  Standing Shoulder Internal Rotation AAROM with Pulley - 1 x daily - 7 x weekly - 3 sets - 10 reps - Standing External Rotation in Abduction AAROM with Pulley - 1 x daily - 7 x weekly - 3 sets - 10 reps - Supine Shoulder Flexion AAROM with Dowel - 1 x daily - 7 x weekly - 3 sets - 10 reps  01/07/2023: Access Code: [email or web address] URL: [email or web address] Date: 01/07/2023 Prepared by: Joaquin Music  Exercises - Isometric Shoulder Flexion at Wall  -  1 x daily - 7 x weekly - 3 sets - 10 reps - 3secs hold - Isometric Shoulder Extension at Wall  - 1 x daily - 7 x weekly - 2 sets - 10 reps - 3secs hold - Isometric Shoulder External Rotation at Wall  - 1 x daily - 7 x weekly - 2 sets - 10 reps - 3secs hold - Isometric Shoulder Abduction at Wall  - 1 x daily - 7 x weekly - 2 sets - 10 reps - 3secs hold - Standing Isometric Shoulder Internal Rotation at Doorway  - 1 x daily - 7 x weekly - 2 sets - 10 reps - 3 secs hold ASSESSMENT:  CLINICAL IMPRESSION: Patient arrived to physical therapy reporting no shoulder pain at rest.  Pt. Had some short term improvement following the last treatment session. Pt. Continues to demonstrate limited B shoulder motion in all planes. Pt. Has considerable weakness of rotator cuff in all directions (flex, abd, IR, ER) but most especially ER/Flex. PT adjusted serratus punches to 70 deg. Of shoulder flexion to prevent pain during there ex. Activity and strengthen in a limited/pain-free range. PT Cued pt. During there ex. To keep her elbows tucked and keep a slow and controlled pace to ensure proper muscle contraction/isolation. PT increased reps during scapular retraction to help build endurance. Pt. Had no concerns regarding HEP and no increase in B shoulder pain from baseline. Pt. Will benefit from continued skilled PT treatment to continue increasing B shoulder range and strength to allow patient to complete pain-free functional movements.   OBJECTIVE IMPAIRMENTS: Abnormal  gait, decreased activity tolerance, decreased endurance, decreased mobility, decreased ROM, decreased strength, hypomobility, impaired UE functional use, and pain.   ACTIVITY LIMITATIONS: carrying, lifting, dressing, reach over head, and hygiene/grooming  PARTICIPATION LIMITATIONS: cleaning, community activity, and yard work  PERSONAL FACTORS: Age, Past/current experiences, and Time since onset of injury/illness/exacerbation are also affecting patient's functional outcome.   REHAB POTENTIAL: Good  CLINICAL DECISION MAKING: Evolving/moderate complexity  EVALUATION COMPLEXITY: Moderate  GOALS: Goals reviewed with patient Yes  SHORT TERM GOALS: Target date: 01/21/2023  Pt. Will be Independent with HEP to increase B shoulder flexion and abduction by 10 deg. To increase functional mobility of her UE's. Baseline: see above. Goal status: Partially met   LONG TERM GOALS: Target date: 02/11/2023  Pt. Will increase FOTO to 62 to improve pain-free mobility.  Baseline: 51/62 Goal status: INITIAL  2.  Pt. Will report <3/10 B shoulder pain with overhead reaching/ lifting tasks.  Baseline: 10/10 NPS Goal status: INITIAL  3.  PT. Will improve B shoulder strength a 1/2 muscle grade in flexion, abduction, and middle trap to improve functional movement tasks such as putting away dishes and performing hair care.  Baseline: See above. Goal status: INITIAL   PLAN: PT FREQUENCY: 2x/week  PT DURATION: 6 weeks  PLANNED INTERVENTIONS: Therapeutic exercises, Therapeutic activity, Neuromuscular re-education, Gait training, Patient/Family education, Self Care, Joint mobilization, Dry Needling, Cryotherapy, Moist heat, Traction, Ultrasound, and Manual therapy  PLAN FOR NEXT SESSION:  Continue strengthening program, shoulder mobilizations for mobility  Pura Spice, PT, DPT # [phone] 01/23/2023

## 2023-01-28 ENCOUNTER — Encounter: Payer: Self-pay | Admitting: Physical Therapy

## 2023-01-28 ENCOUNTER — Ambulatory Visit: Payer: Medicare Other | Admitting: Physical Therapy

## 2023-01-28 DIAGNOSIS — M6281 Muscle weakness (generalized): Secondary | ICD-10-CM

## 2023-01-28 DIAGNOSIS — M25612 Stiffness of left shoulder, not elsewhere classified: Secondary | ICD-10-CM

## 2023-01-28 DIAGNOSIS — M25611 Stiffness of right shoulder, not elsewhere classified: Secondary | ICD-10-CM | POA: Diagnosis not present

## 2023-01-28 DIAGNOSIS — G8929 Other chronic pain: Secondary | ICD-10-CM

## 2023-01-28 DIAGNOSIS — R293 Abnormal posture: Secondary | ICD-10-CM

## 2023-01-28 NOTE — Therapy (Signed)
OUTPATIENT PHYSICAL THERAPY UPPER EXTREMITY TREATMENT    Patient Name: Virginia Pollard MRN: [id number] DOB:19-May-1946, 77 y.o., female Today's Date: 01/28/2023  END OF SESSION:  PT End of Session - 01/28/23 1431     Visit Number 8    Number of Visits 13    Date for PT Re-Evaluation 02/11/23    PT Start Time [id number]    PT Stop Time [id number]    PT Time Calculation (min) 43 min    Activity Tolerance Patient tolerated treatment well;No increased pain    Behavior During Therapy WFL for tasks assessed/performed             Past Medical History:  Diagnosis Date   Arthritis    Hyperlipidemia    Hypertension    Past Surgical History:  Procedure Laterality Date   TUBAL LIGATION     There are no problems to display for this patient.   PCP: [facility]   REFERRING PROVIDER: Anderson Malta, [facility] Provider   REFERRING DIAG: Acute pain of R shoulder    Bilateral shoulder impingment  THERAPY DIAG:  Shoulder joint stiffness, bilateral  Muscle weakness (generalized)  Chronic pain of both shoulders  Chronic left shoulder pain  Decreased range of motion of left shoulder  Abnormal posture  Rationale for Evaluation and Treatment: Rehabilitation  ONSET DATE: Chronic, recently exacerbated the last 3/4 months   SUBJECTIVE:                                                                                                                                                                                      SUBJECTIVE STATEMENT: Pt. Has previous hx. Of L shoulder pain resolved with PT. Pt. Still has some stiffness in L shoulder. Pt. Reports R is c/c at this time and the pain has been severe(10/10 NPS at times). Pt. States she fell on the L shoulder 1 year ago which brought on the previous PT treatment that has since been resolved. Pt. States difficulty with any overhead activities and is limited in tasks such as hair care and reaching up to retrieve/put  up dishes.  PERTINENT HISTORY: Pt. Has hx. Of chronic shoulder pain in both the R/L shoulder. Pt. States a "black mark" was found on her lungs on a previous scan that she will be following up on with her PCP at the end of February.  Medication Sig Start Date End Date Taking? Authorizing Provider  aspirin EC 81 MG tablet Take by mouth. 03/09/18   Yes [provider]  baclofen (LIORESAL) 10 MG tablet Take 1 tablet (10 mg total) by mouth at bedtime as needed for muscle  spasms. 08/08/21   Yes Danton Clap, PA-C  Cholecalciferol (VITAMIN D-1000 MAX ST) 25 MCG (1000 UT) tablet Take by mouth. 03/09/18   Yes [provider]  lisinopril (PRINIVIL,ZESTRIL) 10 MG tablet Take 10 mg by mouth daily.     Yes [provider]  metFORMIN (GLUCOPHAGE-XR) 500 MG 24 hr tablet Take 500 mg by mouth daily. 07/26/21   Yes [provider]  simvastatin (ZOCOR) 40 MG tablet Take 40 mg by mouth at bedtime. 07/26/21   Yes [provider]  triamcinolone ointment (KENALOG) 0.5 % Apply 1 application topically 2 (two) times daily. 08/08/21   Yes Danton Clap, PA-C  Biotin 1 MG CAPS Take by mouth.       [provider]  predniSONE (DELTASONE) 10 MG tablet Take 5 tabs PO on day 1 and decrease 1 tab daily until complete 08/08/21     Danton Clap, PA-C      Family History      Family History  Problem Relation Age of Onset   Hypertension Mother        Social History Social History        Tobacco Use   Smoking status: Never   Smokeless tobacco: Never  Vaping Use   Vaping Use: Never used  Substance Use Topics   Alcohol use: No   Drug use: No        Allergies              Patient has no known allergies.     Review of Systems Review of Systems  Constitutional: Negative.   Respiratory: Negative.    Cardiovascular: Negative.   Musculoskeletal:  Positive for myalgias. Negative for arthralgias, back pain, gait problem, joint swelling, neck pain and neck  stiffness.  Skin: Negative.   Neurological: Negative.    PAIN:  Are you having pain? Yes L: 5/10 NPS R: 7/10 NPS  Pain location: B upper middle shoulder  Pain description: sharp with initiation of activity, constant dull ache Aggravating factors: reaching overhead Relieving factors: pullys, snow angels, shots, ibuprofen  Worst: 10/10 NPS Best: 4/10 NPS  PRECAUTIONS: None  WEIGHT BEARING RESTRICTIONS: No  FALLS:  Has patient fallen in last 6 months? Yes. Number of falls 1  LIVING ENVIRONMENT: Lives with: lives alone Lives in: House/apartment Stairs: No Has following equipment at home: None  OCCUPATION: Retired. Active in the church.   PLOF: Independent  PATIENT GOALS: Pt. Wants to get back to working in her yard, traveling, and live a full life without pain.   NEXT MD VISIT: 01/30/2023  OBJECTIVE:   DIAGNOSTIC FINDINGS:  See MD note  PATIENT SURVEYS :  FOTO initial 51/ goal 62  COGNITION: Overall cognitive status: Within functional limits for tasks assessed     SENSATION: WFL  POSTURE: WNL  UPPER EXTREMITY ROM:   Active ROM Right eval Left eval  Shoulder flexion 142 deg. 134 deg.  Shoulder extension [facility]  Shoulder abduction 76 deg. 79 deg.  Shoulder adduction    Shoulder internal rotation 70 deg. 51 deg.  Shoulder external rotation 69 deg. 39 deg.  Elbow flexion [facility] WFL  Elbow extension [facility]  Wrist flexion [facility] WFL  Wrist extension [facility] WFL  Wrist ulnar deviation    Wrist radial deviation    Wrist pronation    Wrist supination    (Blank rows = not tested)  Seated shoulder flexion: L: 93 deg./  R: 122 deg.  UPPER EXTREMITY MMT:  MMT Right eval Left eval  Shoulder flexion 4-/5 4-/5  Shoulder extension    Shoulder abduction 4/5 4/5  Shoulder adduction    Shoulder internal rotation 5/5 5/5  Shoulder external rotation 4-/5 4-/5  Middle trapezius 3+/5 3+/5  Lower trapezius    Elbow flexion 4/5 4/5  Elbow extension 4/5 4/5  Wrist  flexion    Wrist extension    Wrist ulnar deviation    Wrist radial deviation    Wrist pronation    Wrist supination    Grip strength (lbs)    (Blank rows = not tested)  JOINT MOBILITY TESTING:  PT performed B :  P-A- hypomobile A-P- hypomobile Inf. Glide- hypomobile  PALPATION:  PT. Noted some tenderness to palpation around both the R and L glenohumeral joint line.    TODAY'S TREATMENT:                                                                                                                                         DATE: 01/28/2023  Subjective: Pt. Arrived to PT reporting no L or R shoulder pain at rest.  Marked increase in pain with overhead reaching.  Pt. Reports compliance with HEP with no concerns. Pt. 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  Pt. Returned home late last night and is tired.  Pt. Has MD f/u tomorrow.    There Ex.:  -Biodex UBE bike 3 min. Fwd/ 3 min. Bkwd  -Seated shoulder pulley ex.: flexion/ abduction 20x each.  Seated flexion AA/PROM: L: 127 deg./ R: 146 deg.    -Seated shoulder flexion AROM: L: 89 deg. (Slight regression/ marked muscle fatigue)/  R: 120 deg. (Increase pain).    -Seated B wand ex. (AA/PROM)- with PT assist 10x all planes.  Significant difficulty with chest press/ increase R shoulder pain as compared to L.    -Supine wand ex.: chest press/ shoulder flexion 20x.    -B supine serratus punches un weighted (10x each)- cuing to slow down.       -Supine B Isometric resistance to all planes. IR/ ER/ Abd. 10x each direction with mod. Resistance. 3 sec. Hold during each rep.   -Standing 2# shoulder circles/ biceps curls 20x.  Standing scapular retraction/tricep ext./ horizontal flexion with GTB. 20x reps to increase muscular endurance.  Difficulty with shoulder flexion/ compensation with biceps.     PATIENT EDUCATION: Education details: Pt. Educated on strength and mobility deficits found during PT evaluation. Pt. Educated  on prognosis, treatment POC, and frequency of treatment. PT demonstrated HEP.  Person educated: Patient Education method: Explanation, Demonstration, Tactile cues, Verbal cues, and Handouts Education comprehension: verbalized understanding and returned demonstration  HOME EXERCISE PROGRAM: Access Code: [email or web address] URL: [email or web address] Date: 12/31/2022 Prepared by: Dorcas Carrow Exercises - Seated Shoulder Flexion AAROM with Pulley Behind - 1 x daily - 7 x weekly - 3 sets - 10 reps -  Seated Shoulder Abduction AAROM with Pulley Behind - 1 x daily - 7 x weekly - 3 sets - 10 reps - Standing Shoulder Internal Rotation AAROM with Pulley - 1 x daily - 7 x weekly - 3 sets - 10 reps - Standing External Rotation in Abduction AAROM with Pulley - 1 x daily - 7 x weekly - 3 sets - 10 reps - Supine Shoulder Flexion AAROM with Dowel - 1 x daily - 7 x weekly - 3 sets - 10 reps  01/07/2023: Access Code: [email or web address] URL: [email or web address] Date: 01/07/2023 Prepared by: Joaquin Music  Exercises - Isometric Shoulder Flexion at Wall  - 1 x daily - 7 x weekly - 3 sets - 10 reps - 3secs hold - Isometric Shoulder Extension at Wall  - 1 x daily - 7 x weekly - 2 sets - 10 reps - 3secs hold - Isometric Shoulder External Rotation at Wall  - 1 x daily - 7 x weekly - 2 sets - 10 reps - 3secs hold - Isometric Shoulder Abduction at Wall  - 1 x daily - 7 x weekly - 2 sets - 10 reps - 3secs hold - Standing Isometric Shoulder Internal Rotation at Doorway  - 1 x daily - 7 x weekly - 2 sets - 10 reps - 3 secs hold ASSESSMENT:  CLINICAL IMPRESSION: Patient arrived to physical therapy reporting no shoulder pain at rest.  Pt. Continues to demonstrate limited B shoulder motion in all planes. Pt. Has considerable weakness of rotator cuff in all directions (flex, abd, IR, ER) but most especially ER/Flex. PT Cued pt. During there ex. To keep her elbows tucked and keep a slow and controlled pace to ensure proper  muscle contraction/isolation.  Pt. Had no concerns regarding HEP and no increase in B shoulder pain from baseline. Pt. Will benefit from continued skilled PT treatment to continue increasing B shoulder range and strength to allow patient to complete pain-free functional movements.   OBJECTIVE IMPAIRMENTS: Abnormal gait, decreased activity tolerance, decreased endurance, decreased mobility, decreased ROM, decreased strength, hypomobility, impaired UE functional use, and pain.   ACTIVITY LIMITATIONS: carrying, lifting, dressing, reach over head, and hygiene/grooming  PARTICIPATION LIMITATIONS: cleaning, community activity, and yard work  PERSONAL FACTORS: Age, Past/current experiences, and Time since onset of injury/illness/exacerbation are also affecting patient's functional outcome.   REHAB POTENTIAL: Good  CLINICAL DECISION MAKING: Evolving/moderate complexity  EVALUATION COMPLEXITY: Moderate  GOALS: Goals reviewed with patient Yes  SHORT TERM GOALS: Target date: 01/21/2023  Pt. Will be Independent with HEP to increase B shoulder flexion and abduction by 10 deg. To increase functional mobility of her UE's. Baseline: see above. Goal status: Partially met   LONG TERM GOALS: Target date: 02/11/2023  Pt. Will increase FOTO to 62 to improve pain-free mobility.  Baseline: 51/62 Goal status: INITIAL  2.  Pt. Will report <3/10 B shoulder pain with overhead reaching/ lifting tasks.  Baseline: 10/10 NPS Goal status: INITIAL  3.  PT. Will improve B shoulder strength a 1/2 muscle grade in flexion, abduction, and middle trap to improve functional movement tasks such as putting away dishes and performing hair care.  Baseline: See above. Goal status: INITIAL   PLAN: PT FREQUENCY: 2x/week  PT DURATION: 6 weeks  PLANNED INTERVENTIONS: Therapeutic exercises, Therapeutic activity, Neuromuscular re-education, Gait training, Patient/Family education, Self Care, Joint mobilization, Dry  Needling, Cryotherapy, Moist heat, Traction, Ultrasound, and Manual therapy  PLAN FOR NEXT SESSION:  Continue strengthening program, shoulder mobilizations for mobility.  CHECK FOTO  Pura Spice, PT, DPT # [phone] 01/28/2023

## 2023-01-30 ENCOUNTER — Encounter: Payer: Medicare Other | Admitting: Physical Therapy

## 2023-02-04 ENCOUNTER — Encounter: Payer: Medicare Other | Admitting: Physical Therapy

## 2023-02-06 ENCOUNTER — Ambulatory Visit: Payer: Medicare Other | Admitting: Physical Therapy

## 2023-02-06 ENCOUNTER — Encounter: Payer: Self-pay | Admitting: Physical Therapy

## 2023-02-06 DIAGNOSIS — M25611 Stiffness of right shoulder, not elsewhere classified: Secondary | ICD-10-CM

## 2023-02-06 DIAGNOSIS — G8929 Other chronic pain: Secondary | ICD-10-CM

## 2023-02-06 DIAGNOSIS — M6281 Muscle weakness (generalized): Secondary | ICD-10-CM

## 2023-02-06 NOTE — Therapy (Signed)
OUTPATIENT PHYSICAL THERAPY UPPER EXTREMITY TREATMENT    Patient Name: Virginia Pollard MRN: [id number] DOB:1946/04/15, 77 y.o., female Today's Date: 02/06/2023  END OF SESSION:  PT End of Session - 02/06/23 1426     Visit Number 9    Number of Visits 13    Date for PT Re-Evaluation 02/11/23    PT Start Time 1426    PT Stop Time 1510    PT Time Calculation (min) 44 min    Activity Tolerance Patient tolerated treatment well    Behavior During Therapy WFL for tasks assessed/performed             Past Medical History:  Diagnosis Date   Arthritis    Hyperlipidemia    Hypertension    Past Surgical History:  Procedure Laterality Date   TUBAL LIGATION     There are no problems to display for this patient.   PCP: [facility] [place] [facility] - General   REFERRING PROVIDER: Anderson Malta, [facility] Provider   REFERRING DIAG: Acute pain of R shoulder    Bilateral shoulder impingment  THERAPY DIAG:  Shoulder joint stiffness, bilateral  Muscle weakness (generalized)  Chronic pain of both shoulders  Rationale for Evaluation and Treatment: Rehabilitation  ONSET DATE: Chronic, recently exacerbated the last 3/4 months   SUBJECTIVE:                                                                                                                                                                                      SUBJECTIVE STATEMENT: Pt. Has previous hx. Of L shoulder pain resolved with PT. Pt. Still has some stiffness in L shoulder. Pt. Reports R is c/c at this time and the pain has been severe(10/10 NPS at times). Pt. States she fell on the L shoulder 1 year ago which brought on the previous PT treatment that has since been resolved. Pt. States difficulty with any overhead activities and is limited in tasks such as hair care and reaching up to retrieve/put up dishes.  PERTINENT HISTORY: Pt. Has hx. Of chronic shoulder pain in both the R/L shoulder. Pt. States a  "black mark" was found on her lungs on a previous scan that she will be following up on with her PCP at the end of February.  Medication Sig Start Date End Date Taking? Authorizing Provider  aspirin EC 81 MG tablet Take by mouth. 03/09/18   Yes [provider]  baclofen (LIORESAL) 10 MG tablet Take 1 tablet (10 mg total) by mouth at bedtime as needed for muscle spasms. 08/08/21   Yes Danton Clap, PA-C  Cholecalciferol (VITAMIN D-1000 MAX ST) 25 MCG (1000  UT) tablet Take by mouth. 03/09/18   Yes [provider]  lisinopril (PRINIVIL,ZESTRIL) 10 MG tablet Take 10 mg by mouth daily.     Yes [provider]  metFORMIN (GLUCOPHAGE-XR) 500 MG 24 hr tablet Take 500 mg by mouth daily. 07/26/21   Yes [provider]  simvastatin (ZOCOR) 40 MG tablet Take 40 mg by mouth at bedtime. 07/26/21   Yes [provider]  triamcinolone ointment (KENALOG) 0.5 % Apply 1 application topically 2 (two) times daily. 08/08/21   Yes Danton Clap, PA-C  Biotin 1 MG CAPS Take by mouth.       [provider]  predniSONE (DELTASONE) 10 MG tablet Take 5 tabs PO on day 1 and decrease 1 tab daily until complete 08/08/21     Danton Clap, PA-C      Family History      Family History  Problem Relation Age of Onset   Hypertension Mother        Social History Social History        Tobacco Use   Smoking status: Never   Smokeless tobacco: Never  Vaping Use   Vaping Use: Never used  Substance Use Topics   Alcohol use: No   Drug use: No        Allergies              Patient has no known allergies.     Review of Systems Review of Systems  Constitutional: Negative.   Respiratory: Negative.    Cardiovascular: Negative.   Musculoskeletal:  Positive for myalgias. Negative for arthralgias, back pain, gait problem, joint swelling, neck pain and neck stiffness.  Skin: Negative.   Neurological: Negative.    PAIN:  Are you having pain? Yes L: 5/10 NPS R: 7/10 NPS   Pain location: B upper middle shoulder  Pain description: sharp with initiation of activity, constant dull ache Aggravating factors: reaching overhead Relieving factors: pullys, snow angels, shots, ibuprofen  Worst: 10/10 NPS Best: 4/10 NPS  PRECAUTIONS: None  WEIGHT BEARING RESTRICTIONS: No  FALLS:  Has patient fallen in last 6 months? Yes. Number of falls 1  LIVING ENVIRONMENT: Lives with: lives alone Lives in: House/apartment Stairs: No Has following equipment at home: None  OCCUPATION: Retired. Active in the church.   PLOF: Independent  PATIENT GOALS: Pt. Wants to get back to working in her yard, traveling, and live a full life without pain.   NEXT MD VISIT: 01/30/2023  OBJECTIVE:   DIAGNOSTIC FINDINGS:  See MD note  PATIENT SURVEYS :  FOTO initial 51/ goal 10  COGNITION: Overall cognitive status: Within functional limits for tasks assessed     SENSATION: WFL  POSTURE: WNL  UPPER EXTREMITY ROM:   Active ROM Right eval Left eval  Shoulder flexion 142 deg. 134 deg.  Shoulder extension [facility]  Shoulder abduction 76 deg. 79 deg.  Shoulder adduction    Shoulder internal rotation 70 deg. 51 deg.  Shoulder external rotation 69 deg. 39 deg.  Elbow flexion [facility] WFL  Elbow extension [facility]  Wrist flexion [facility] WFL  Wrist extension [facility] WFL  Wrist ulnar deviation    Wrist radial deviation    Wrist pronation    Wrist supination    (Blank rows = not tested)  Seated shoulder flexion: L: 93 deg./  R: 122 deg.  UPPER EXTREMITY MMT:  MMT Right eval Left eval  Shoulder flexion 4-/5 4-/5  Shoulder extension    Shoulder abduction  4/5 4/5  Shoulder adduction    Shoulder internal rotation 5/5 5/5  Shoulder external rotation 4-/5 4-/5  Middle trapezius 3+/5 3+/5  Lower trapezius    Elbow flexion 4/5 4/5  Elbow extension 4/5 4/5  Wrist flexion    Wrist extension    Wrist ulnar deviation    Wrist radial deviation    Wrist pronation    Wrist  supination    Grip strength (lbs)    (Blank rows = not tested)  JOINT MOBILITY TESTING:  PT performed B :  P-A- hypomobile A-P- hypomobile Inf. Glide- hypomobile  PALPATION:  PT. Noted some tenderness to palpation around both the R and L glenohumeral joint line.   01/28/23:  -Seated shoulder pulley ex.: flexion/ abduction 20x each.  Seated flexion AA/PROM: L: 127 deg./ R: 146 deg.    -Seated shoulder flexion AROM: L: 89 deg. (Slight regression/ marked muscle fatigue)/  R: 120 deg. (Increase pain).    TODAY'S TREATMENT:                                                                                                                                         DATE: 02/06/2023  Subjective: Pt. states shoulders "are fair" and has no new complaints.  Marked increase in pain with overhead reaching.  Pt. Reports compliance with HEP with no concerns.  Pt. Has MD appt. For chest x-ray next Wednesday.  Pt. Saw PCP last week and A1C was good.      There Ex.:  -Biodex UBE bike 3 min. Fwd/ 3 min. Bkwd  -Seated B wand ex. (AA/PROM)- with PT assist 10x all planes.  Significant difficulty with chest press/ increase R shoulder pain as compared to L.  Moderate PT assist to manage pain/ proper technique.  Pharmacist, hospital.  Added bicep curls (no issues)  -B supine serratus punches un weighted (10x each)- cuing to slow down.       -Supine B Isometric resistance to all planes. IR/ ER/ Abd. 10x each direction with mod. Resistance. 3 sec. Hold during each rep.   -Standing scapular retraction/tricep ext./ horizontal flexion/ extension with GTB. 20x reps to increase muscular endurance.  Difficulty with shoulder flexion/ compensation with biceps.    Manual tx.:  Supine L/R shoulder inferior/ AP grade II-III mobs. At 45 deg. To 70 deg. 5x20 sec.  No pain with inferior mobs.    Supine L/R shoulder AA/PROM as tolerated 5x each.  Pain limited and significant crepitus.      PATIENT EDUCATION: Education details:  Pt. Educated on strength and mobility deficits found during PT evaluation. Pt. Educated on prognosis, treatment POC, and frequency of treatment. PT demonstrated HEP.  Person educated: Patient Education method: Explanation, Demonstration, Tactile cues, Verbal cues, and Handouts Education comprehension: verbalized understanding and returned demonstration  HOME EXERCISE PROGRAM: Access Code: [email or web address] URL: [email or web address] Date: 12/31/2022 Prepared by: Dorcas Carrow Exercises - Seated Shoulder  Flexion AAROM with Pulley Behind - 1 x daily - 7 x weekly - 3 sets - 10 reps - Seated Shoulder Abduction AAROM with Pulley Behind - 1 x daily - 7 x weekly - 3 sets - 10 reps - Standing Shoulder Internal Rotation AAROM with Pulley - 1 x daily - 7 x weekly - 3 sets - 10 reps - Standing External Rotation in Abduction AAROM with Pulley - 1 x daily - 7 x weekly - 3 sets - 10 reps - Supine Shoulder Flexion AAROM with Dowel - 1 x daily - 7 x weekly - 3 sets - 10 reps  01/07/2023: Access Code: [email or web address] URL: [email or web address] Date: 01/07/2023 Prepared by: Joaquin Music  Exercises - Isometric Shoulder Flexion at Wall  - 1 x daily - 7 x weekly - 3 sets - 10 reps - 3secs hold - Isometric Shoulder Extension at Wall  - 1 x daily - 7 x weekly - 2 sets - 10 reps - 3secs hold - Isometric Shoulder External Rotation at Wall  - 1 x daily - 7 x weekly - 2 sets - 10 reps - 3secs hold - Isometric Shoulder Abduction at Wall  - 1 x daily - 7 x weekly - 2 sets - 10 reps - 3secs hold - Standing Isometric Shoulder Internal Rotation at Doorway  - 1 x daily - 7 x weekly - 2 sets - 10 reps - 3 secs hold ASSESSMENT:  CLINICAL IMPRESSION: Patient arrived to physical therapy reporting no shoulder pain at rest.  Pt. Continues to demonstrate limited B shoulder motion in all planes. Pt. Has considerable weakness of rotator cuff in all directions (flex, abd, IR, ER) but most especially ER/Flex. PT Cued pt. During  there ex. To keep her elbows tucked and keep a slow and controlled pace to ensure proper muscle contraction/isolation.  Pt. Had no concerns regarding HEP and no increase in B shoulder pain from baseline. Pt. Will benefit from continued skilled PT treatment to continue increasing B shoulder range and strength to allow patient to complete pain-free functional movements.   OBJECTIVE IMPAIRMENTS: Abnormal gait, decreased activity tolerance, decreased endurance, decreased mobility, decreased ROM, decreased strength, hypomobility, impaired UE functional use, and pain.   ACTIVITY LIMITATIONS: carrying, lifting, dressing, reach over head, and hygiene/grooming  PARTICIPATION LIMITATIONS: cleaning, community activity, and yard work  PERSONAL FACTORS: Age, Past/current experiences, and Time since onset of injury/illness/exacerbation are also affecting patient's functional outcome.   REHAB POTENTIAL: Good  CLINICAL DECISION MAKING: Evolving/moderate complexity  EVALUATION COMPLEXITY: Moderate  GOALS: Goals reviewed with patient Yes  SHORT TERM GOALS: Target date: 01/21/2023  Pt. Will be Independent with HEP to increase B shoulder flexion and abduction by 10 deg. To increase functional mobility of her UE's. Baseline: see above. Goal status: Partially met   LONG TERM GOALS: Target date: 02/11/2023  Pt. Will increase FOTO to 62 to improve pain-free mobility.  Baseline: 51/62 Goal status: INITIAL  2.  Pt. Will report <3/10 B shoulder pain with overhead reaching/ lifting tasks.  Baseline: 10/10 NPS Goal status: INITIAL  3.  PT. Will improve B shoulder strength a 1/2 muscle grade in flexion, abduction, and middle trap to improve functional movement tasks such as putting away dishes and performing hair care.  Baseline: See above. Goal status: INITIAL   PLAN: PT FREQUENCY: 2x/week  PT DURATION: 6 weeks  PLANNED INTERVENTIONS: Therapeutic exercises, Therapeutic activity, Neuromuscular  re-education, Gait training, Patient/Family education, Self Care, Joint mobilization, Dry Needling, Cryotherapy, Moist heat, Traction,  Ultrasound, and Manual therapy  PLAN FOR NEXT SESSION:  Continue strengthening program, shoulder mobilizations for mobility.  10th visit progress noted/ RECERT next tx.    Pura Spice, PT, DPT # [phone] 02/06/2023

## 2023-02-20 ENCOUNTER — Encounter: Payer: Self-pay | Admitting: Physical Therapy

## 2023-02-20 ENCOUNTER — Ambulatory Visit: Payer: Medicare Other | Attending: Orthopedic Surgery | Admitting: Physical Therapy

## 2023-02-20 DIAGNOSIS — G8929 Other chronic pain: Secondary | ICD-10-CM | POA: Diagnosis present

## 2023-02-20 DIAGNOSIS — M25512 Pain in left shoulder: Secondary | ICD-10-CM | POA: Diagnosis present

## 2023-02-20 DIAGNOSIS — M25612 Stiffness of left shoulder, not elsewhere classified: Secondary | ICD-10-CM | POA: Insufficient documentation

## 2023-02-20 DIAGNOSIS — R293 Abnormal posture: Secondary | ICD-10-CM | POA: Insufficient documentation

## 2023-02-20 DIAGNOSIS — M25611 Stiffness of right shoulder, not elsewhere classified: Secondary | ICD-10-CM | POA: Diagnosis not present

## 2023-02-20 DIAGNOSIS — M6281 Muscle weakness (generalized): Secondary | ICD-10-CM | POA: Insufficient documentation

## 2023-02-20 DIAGNOSIS — M25511 Pain in right shoulder: Secondary | ICD-10-CM | POA: Insufficient documentation

## 2023-02-20 NOTE — Therapy (Addendum)
OUTPATIENT PHYSICAL THERAPY UPPER EXTREMITY TREATMENT/ RECERTIFICATION Physical Therapy Progress Note  Dates of reporting period  12/31/22   to   02/20/23    Patient Name: Virginia Pollard MRN: [id number] DOB:1946-03-17, 77 y.o., female Today's Date: 02/20/2023  END OF SESSION:  PT End of Session - 02/20/23 1729     Visit Number 10    Number of Visits 18    Date for PT Re-Evaluation 03/20/23    PT Start Time 1728    PT Stop Time 1814    PT Time Calculation (min) 46 min    Activity Tolerance Patient tolerated treatment well    Behavior During Therapy [facility] for tasks assessed/performed             Past Medical History:  Diagnosis Date   Arthritis    Hyperlipidemia    Hypertension    Past Surgical History:  Procedure Laterality Date   TUBAL LIGATION     There are no problems to display for this patient.   [facility]   REFERRING PROVIDER: Anderson Malta, [facility] Provider   REFERRING DIAG: Acute pain of R shoulder    Bilateral shoulder impingment  THERAPY DIAG:  Shoulder joint stiffness, bilateral  Muscle weakness (generalized)  Chronic pain of both shoulders  Chronic left shoulder pain  Decreased range of motion of left shoulder  Abnormal posture  Rationale for Evaluation and Treatment: Rehabilitation  ONSET DATE: Chronic, recently exacerbated the last 3/4 months   SUBJECTIVE:                                                                                                                                                                                      SUBJECTIVE STATEMENT:  EVALUATION Pt. Has previous hx. Of L shoulder pain resolved with PT. Pt. Still has some stiffness in L shoulder. Pt. Reports R is c/c at this time and the pain has been severe(10/10 NPS at times). Pt. States she fell on the L shoulder 1 year ago which brought on the previous PT treatment that has since been resolved. Pt. States difficulty with any  overhead activities and is limited in tasks such as hair care and reaching up to retrieve/put up dishes.  PERTINENT HISTORY: Pt. Has hx. Of chronic shoulder pain in both the R/L shoulder. Pt. States a "black mark" was found on her lungs on a previous scan that she will be following up on with her PCP at the end of February.  Medication Sig Start Date End Date Taking? Authorizing Provider  aspirin EC 81 MG tablet Take by mouth. 03/09/18   Yes [provider]  baclofen (  LIORESAL) 10 MG tablet Take 1 tablet (10 mg total) by mouth at bedtime as needed for muscle spasms. 08/08/21   Yes Danton Clap, PA-C  Cholecalciferol (VITAMIN D-1000 MAX ST) 25 MCG (1000 UT) tablet Take by mouth. 03/09/18   Yes [provider]  lisinopril (PRINIVIL,ZESTRIL) 10 MG tablet Take 10 mg by mouth daily.     Yes [provider]  metFORMIN (GLUCOPHAGE-XR) 500 MG 24 hr tablet Take 500 mg by mouth daily. 07/26/21   Yes [provider]  simvastatin (ZOCOR) 40 MG tablet Take 40 mg by mouth at bedtime. 07/26/21   Yes [provider]  triamcinolone ointment (KENALOG) 0.5 % Apply 1 application topically 2 (two) times daily. 08/08/21   Yes Danton Clap, PA-C  Biotin 1 MG CAPS Take by mouth.       [provider]  predniSONE (DELTASONE) 10 MG tablet Take 5 tabs PO on day 1 and decrease 1 tab daily until complete 08/08/21     Danton Clap, PA-C      Family History      Family History  Problem Relation Age of Onset   Hypertension Mother        Social History Social History        Tobacco Use   Smoking status: Never   Smokeless tobacco: Never  Vaping Use   Vaping Use: Never used  Substance Use Topics   Alcohol use: No   Drug use: No        Allergies              Patient has no known allergies.     Review of Systems Review of Systems  Constitutional: Negative.   Respiratory: Negative.    Cardiovascular: Negative.   Musculoskeletal:  Positive for myalgias.  Negative for arthralgias, back pain, gait problem, joint swelling, neck pain and neck stiffness.  Skin: Negative.   Neurological: Negative.    PAIN:  Are you having pain? Yes L: 5/10 NPS R: 7/10 NPS  Pain location: B upper middle shoulder  Pain description: sharp with initiation of activity, constant dull ache Aggravating factors: reaching overhead Relieving factors: pullys, snow angels, shots, ibuprofen  Worst: 10/10 NPS Best: 4/10 NPS  PRECAUTIONS: None  WEIGHT BEARING RESTRICTIONS: No  FALLS:  Has patient fallen in last 6 months? Yes. Number of falls 1  LIVING ENVIRONMENT: Lives with: lives alone Lives in: House/apartment Stairs: No Has following equipment at home: None  OCCUPATION: Retired. Active in the church.   PLOF: Independent  PATIENT GOALS: Pt. Wants to get back to working in her yard, traveling, and live a full life without pain.   NEXT MD VISIT: 01/30/2023  OBJECTIVE:   DIAGNOSTIC FINDINGS:  See MD note  PATIENT SURVEYS :  FOTO initial 51/ goal 56  COGNITION: Overall cognitive status: Within functional limits for tasks assessed     SENSATION: WFL  POSTURE: WNL  UPPER EXTREMITY ROM:   Active ROM Right eval Left eval  Shoulder flexion 142 deg. 134 deg.  Shoulder extension [facility]  Shoulder abduction 76 deg. 79 deg.  Shoulder adduction    Shoulder internal rotation 70 deg. 51 deg.  Shoulder external rotation 69 deg. 39 deg.  Elbow flexion [facility] WFL  Elbow extension [facility]  Wrist flexion [facility] WFL  Wrist extension [facility] WFL  Wrist ulnar deviation    Wrist radial deviation    Wrist pronation    Wrist supination    (Blank rows =  not tested)  Seated shoulder flexion: L: 93 deg./  R: 122 deg.  UPPER EXTREMITY MMT:  MMT Right eval Left eval  Shoulder flexion 4-/5 4-/5  Shoulder extension    Shoulder abduction 4/5 4/5  Shoulder adduction    Shoulder internal rotation 5/5 5/5  Shoulder external rotation 4-/5 4-/5  Middle trapezius  3+/5 3+/5  Lower trapezius    Elbow flexion 4/5 4/5  Elbow extension 4/5 4/5  Wrist flexion    Wrist extension    Wrist ulnar deviation    Wrist radial deviation    Wrist pronation    Wrist supination    Grip strength (lbs)    (Blank rows = not tested)  JOINT MOBILITY TESTING:  PT performed B :  P-A- hypomobile A-P- hypomobile Inf. Glide- hypomobile  PALPATION:  PT. Noted some tenderness to palpation around both the R and L glenohumeral joint line.   01/28/23:  -Seated shoulder pulley ex.: flexion/ abduction 20x each.  Seated flexion AA/PROM: L: 127 deg./ R: 146 deg.    -Seated shoulder flexion AROM: L: 89 deg. (Slight regression/ marked muscle fatigue)/  R: 120 deg. (Increase pain).    TODAY'S TREATMENT:                                                                                                                                         DATE: 02/20/2023  Subjective: Pt. states shoulders do better with warmer weather.  Pt. Reports compliance with HEP and had a great 77th birthday with family.  Pt. Has f/u with MD next Tuesday to discuss lung imaging/ testing.  Pt. Has stopped taking Ibuprofen and reports no difference in pain.         There Ex.:  -Biodex UBE bike 3 min. Fwd/ 3 min. Bkwd  -Seated B wand ex. (AA/PROM)- with PT assist 10x all planes.  Significant difficulty with chest press/ increase R shoulder pain as compared to L.  Moderate PT assist to manage pain/ proper technique.  Mirror feedback.    -Standing B wand ex.: bicep curls/ shoulder extension/ IR 20x.  Mirror feedback for shoulder position/ posture correction.    -Nautilus: seated lat. Pull downs (30#)/ standing tricep extension (20#)/ sh. Adduction with handles (20#)- 20x each.  -B supine serratus punches 2# (10x each)- cuing to slow down.       -Supine B Isometric resistance to all planes. IR/ ER/ Abd. 10x each direction with mod. Resistance. 3 sec. Hold during each rep.   -Reviewed HEP.     Manual  tx.:  Supine L/R shoulder inferior/ AP grade II-III mobs. At 45 deg. To 70 deg. 5x20 sec.  No pain with inferior mobs.    Supine L/R shoulder AA/PROM as tolerated 5x each.  Pain limited and significant crepitus.      PATIENT EDUCATION: Education details: Pt. Educated on strength and mobility deficits found during PT evaluation.  Pt. Educated on prognosis, treatment POC, and frequency of treatment. PT demonstrated HEP.  Person educated: Patient Education method: Explanation, Demonstration, Tactile cues, Verbal cues, and Handouts Education comprehension: verbalized understanding and returned demonstration  HOME EXERCISE PROGRAM: Access Code: [email or web address] URL: [email or web address] Date: 12/31/2022 Prepared by: Dorene Grebe Exercises - Seated Shoulder Flexion AAROM with Pulley Behind - 1 x daily - 7 x weekly - 3 sets - 10 reps - Seated Shoulder Abduction AAROM with Pulley Behind - 1 x daily - 7 x weekly - 3 sets - 10 reps - Standing Shoulder Internal Rotation AAROM with Pulley - 1 x daily - 7 x weekly - 3 sets - 10 reps - Standing External Rotation in Abduction AAROM with Pulley - 1 x daily - 7 x weekly - 3 sets - 10 reps - Supine Shoulder Flexion AAROM with Dowel - 1 x daily - 7 x weekly - 3 sets - 10 reps  01/07/2023: Access Code: [email or web address] URL: [email or web address] Date: 01/07/2023 Prepared by: Janet Berlin  Exercises - Isometric Shoulder Flexion at Wall  - 1 x daily - 7 x weekly - 3 sets - 10 reps - 3secs hold - Isometric Shoulder Extension at Wall  - 1 x daily - 7 x weekly - 2 sets - 10 reps - 3secs hold - Isometric Shoulder External Rotation at Wall  - 1 x daily - 7 x weekly - 2 sets - 10 reps - 3secs hold - Isometric Shoulder Abduction at Wall  - 1 x daily - 7 x weekly - 2 sets - 10 reps - 3secs hold - Standing Isometric Shoulder Internal Rotation at Doorway  - 1 x daily - 7 x weekly - 2 sets - 10 reps - 3 secs hold ASSESSMENT:  CLINICAL IMPRESSION: Patient  arrived to physical therapy reporting no shoulder pain at rest.  Pt. Continues to demonstrate limited B shoulder motion in all planes. Pt. Has considerable weakness of rotator cuff in all directions (flex, abd, IR, ER) but most especially ER/Flex. PT Cued pt. During there ex. To keep her elbows tucked and keep a slow and controlled pace to ensure proper muscle contraction/isolation.  Pt. Had no concerns regarding HEP and no increase in B shoulder pain from baseline. Pt. Will benefit from continued skilled PT treatment to continue increasing B shoulder range and strength to allow patient to complete pain-free functional movements.   OBJECTIVE IMPAIRMENTS: Abnormal gait, decreased activity tolerance, decreased endurance, decreased mobility, decreased ROM, decreased strength, hypomobility, impaired UE functional use, and pain.   ACTIVITY LIMITATIONS: carrying, lifting, dressing, reach over head, and hygiene/grooming  PARTICIPATION LIMITATIONS: cleaning, community activity, and yard work  PERSONAL FACTORS: Age, Past/current experiences, and Time since onset of injury/illness/exacerbation are also affecting patient's functional outcome.   REHAB POTENTIAL: Good  CLINICAL DECISION MAKING: Evolving/moderate complexity  EVALUATION COMPLEXITY: Moderate  GOALS: Goals reviewed with patient Yes  SHORT TERM GOALS: Target date: 03/06/2023  Pt. Will be Independent with HEP to increase B shoulder flexion and abduction by 10 deg. To increase functional mobility of her UE's. Baseline: see above. Goal status: Partially met   LONG TERM GOALS: Target date: 03/20/2023  Pt. Will increase FOTO to 62 to improve pain-free mobility.  Baseline: 51/62 Goal status: On-going  2.  Pt. Will report <3/10 B shoulder pain with overhead reaching/ lifting tasks.  Baseline: 10/10 NPS Goal status: Partially met  3.  PT. Will improve B shoulder strength a 1/2 muscle grade in flexion, abduction, and middle trap to improve  functional movement tasks such as putting away dishes and performing hair care.  Baseline: See above. Goal status: Not met   PLAN: PT FREQUENCY: 2x/week  PT DURATION: 4 weeks  PLANNED INTERVENTIONS: Therapeutic exercises, Therapeutic activity, Neuromuscular re-education, Gait training, Patient/Family education, Self Care, Joint mobilization, Dry Needling, Cryotherapy, Moist heat, Traction, Ultrasound, and Manual therapy  PLAN FOR NEXT SESSION:  Continue strengthening program, shoulder mobilizations for mobility.  CHECK FOTO.    Cammie Mcgee, PT, DPT # [phone] 02/20/2023

## 2023-02-26 ENCOUNTER — Ambulatory Visit: Payer: Medicare Other | Admitting: Physical Therapy

## 2023-02-26 DIAGNOSIS — M6281 Muscle weakness (generalized): Secondary | ICD-10-CM

## 2023-02-26 DIAGNOSIS — M25612 Stiffness of left shoulder, not elsewhere classified: Secondary | ICD-10-CM

## 2023-02-26 DIAGNOSIS — M25611 Stiffness of right shoulder, not elsewhere classified: Secondary | ICD-10-CM | POA: Diagnosis not present

## 2023-02-26 DIAGNOSIS — R293 Abnormal posture: Secondary | ICD-10-CM

## 2023-02-26 DIAGNOSIS — G8929 Other chronic pain: Secondary | ICD-10-CM

## 2023-02-26 NOTE — Therapy (Signed)
OUTPATIENT PHYSICAL THERAPY UPPER EXTREMITY TREATMENT   Patient Name: Virginia Pollard MRN: [id number] DOB:19-May-1946, 77 y.o., female Today's Date: 02/26/2023  END OF SESSION:  PT End of Session - 02/26/23 1433     Visit Number 11    Number of Visits 18    Date for PT Re-Evaluation 03/20/23    PT Start Time 1433    PT Stop Time 1515    PT Time Calculation (min) 42 min    Activity Tolerance Patient tolerated treatment well    Behavior During Therapy WFL for tasks assessed/performed             Past Medical History:  Diagnosis Date   Arthritis    Hyperlipidemia    Hypertension    Past Surgical History:  Procedure Laterality Date   TUBAL LIGATION     There are no problems to display for this patient.   [facility]   REFERRING PROVIDER: Virgina Organ, [facility] Provider   REFERRING DIAG: Acute pain of R shoulder    Bilateral shoulder impingment  THERAPY DIAG:  Shoulder joint stiffness, bilateral  Muscle weakness (generalized)  Chronic pain of both shoulders  Chronic left shoulder pain  Decreased range of motion of left shoulder  Abnormal posture  Rationale for Evaluation and Treatment: Rehabilitation  ONSET DATE: Chronic, recently exacerbated the last 3/4 months   SUBJECTIVE:                                                                                                                                                                                      SUBJECTIVE STATEMENT:  EVALUATION Pt. Has previous hx. Of L shoulder pain resolved with PT. Pt. Still has some stiffness in L shoulder. Pt. Reports R is c/c at this time and the pain has been severe(10/10 NPS at times). Pt. States she fell on the L shoulder 1 year ago which brought on the previous PT treatment that has since been resolved. Pt. States difficulty with any overhead activities and is limited in tasks such as hair care and reaching up to retrieve/put up  dishes.  PERTINENT HISTORY: Pt. Has hx. Of chronic shoulder pain in both the R/L shoulder. Pt. States a "black mark" was found on her lungs on a previous scan that she will be following up on with her PCP at the end of February.  Medication Sig Start Date End Date Taking? Authorizing Provider  aspirin EC 81 MG tablet Take by mouth. 03/09/18   Yes [provider]  baclofen (LIORESAL) 10 MG tablet Take 1 tablet (10 mg total) by mouth at bedtime as needed for muscle spasms.  08/08/21   Yes Shirlee Latch, PA-C  Cholecalciferol (VITAMIN D-1000 MAX ST) 25 MCG (1000 UT) tablet Take by mouth. 03/09/18   Yes [provider]  lisinopril (PRINIVIL,ZESTRIL) 10 MG tablet Take 10 mg by mouth daily.     Yes [provider]  metFORMIN (GLUCOPHAGE-XR) 500 MG 24 hr tablet Take 500 mg by mouth daily. 07/26/21   Yes [provider]  simvastatin (ZOCOR) 40 MG tablet Take 40 mg by mouth at bedtime. 07/26/21   Yes [provider]  triamcinolone ointment (KENALOG) 0.5 % Apply 1 application topically 2 (two) times daily. 08/08/21   Yes Shirlee Latch, PA-C  Biotin 1 MG CAPS Take by mouth.       [provider]  predniSONE (DELTASONE) 10 MG tablet Take 5 tabs PO on day 1 and decrease 1 tab daily until complete 08/08/21     Shirlee Latch, PA-C      Family History      Family History  Problem Relation Age of Onset   Hypertension Mother        Social History Social History        Tobacco Use   Smoking status: Never   Smokeless tobacco: Never  Vaping Use   Vaping Use: Never used  Substance Use Topics   Alcohol use: No   Drug use: No        Allergies              Patient has no known allergies.     Review of Systems Review of Systems  Constitutional: Negative.   Respiratory: Negative.    Cardiovascular: Negative.   Musculoskeletal:  Positive for myalgias. Negative for arthralgias, back pain, gait problem, joint swelling, neck pain and neck stiffness.   Skin: Negative.   Neurological: Negative.    PAIN:  Are you having pain? Yes L: 5/10 NPS R: 7/10 NPS  Pain location: B upper middle shoulder  Pain description: sharp with initiation of activity, constant dull ache Aggravating factors: reaching overhead Relieving factors: pullys, snow angels, shots, ibuprofen  Worst: 10/10 NPS Best: 4/10 NPS  PRECAUTIONS: None  WEIGHT BEARING RESTRICTIONS: No  FALLS:  Has patient fallen in last 6 months? Yes. Number of falls 1  LIVING ENVIRONMENT: Lives with: lives alone Lives in: House/apartment Stairs: No Has following equipment at home: None  OCCUPATION: Retired. Active in the church.   PLOF: Independent  PATIENT GOALS: Pt. Wants to get back to working in her yard, traveling, and live a full life without pain.   NEXT MD VISIT: 01/30/2023  OBJECTIVE:   DIAGNOSTIC FINDINGS:  See MD note  PATIENT SURVEYS :  FOTO initial 51/ goal 55  COGNITION: Overall cognitive status: Within functional limits for tasks assessed     SENSATION: WFL  POSTURE: WNL  UPPER EXTREMITY ROM:   Active ROM Right eval Left eval  Shoulder flexion 142 deg. 134 deg.  Shoulder extension [facility]  Shoulder abduction 76 deg. 79 deg.  Shoulder adduction    Shoulder internal rotation 70 deg. 51 deg.  Shoulder external rotation 69 deg. 39 deg.  Elbow flexion [facility] WFL  Elbow extension [facility]  Wrist flexion [facility] WFL  Wrist extension [facility] WFL  Wrist ulnar deviation    Wrist radial deviation    Wrist pronation    Wrist supination    (Blank rows = not tested)  Seated shoulder flexion: L: 93 deg./  R: 122 deg.  UPPER EXTREMITY MMT:  MMT  Right eval Left eval  Shoulder flexion 4-/5 4-/5  Shoulder extension    Shoulder abduction 4/5 4/5  Shoulder adduction    Shoulder internal rotation 5/5 5/5  Shoulder external rotation 4-/5 4-/5  Middle trapezius 3+/5 3+/5  Lower trapezius    Elbow flexion 4/5 4/5  Elbow extension 4/5 4/5  Wrist flexion     Wrist extension    Wrist ulnar deviation    Wrist radial deviation    Wrist pronation    Wrist supination    Grip strength (lbs)    (Blank rows = not tested)  JOINT MOBILITY TESTING:  PT performed B :  P-A- hypomobile A-P- hypomobile Inf. Glide- hypomobile  PALPATION:  PT. Noted some tenderness to palpation around both the R and L glenohumeral joint line.   01/28/23:  -Seated shoulder pulley ex.: flexion/ abduction 20x each.  Seated flexion AA/PROM: L: 127 deg./ R: 146 deg.    -Seated shoulder flexion AROM: L: 89 deg. (Slight regression/ marked muscle fatigue)/  R: 120 deg. (Increase pain).    TODAY'S TREATMENT:                                                                                                                                         DATE: 02/26/2023  Subjective: Pt. Has PET scan scheduled on May 3rd and f/u on May 7th.  PT discussed in depth pts. MD appt. And her options.    There Ex.:  -Biodex UBE bike 3 min. Fwd/ 3 min. Bkwd  -Seated pulley ex.: flexion/abduction (while listening to MD explanation of lung dx.)  -Nautilus: seated lat. Pull downs (30#)/ standing tricep extension (20#)/ sh. Scapular retraction (30#)-20x each.   -Seated B wand ex. (AA/PROM)- with PT assist 10x all planes.  Significant difficulty with chest press.  Moderate PT assist to manage pain/ proper technique.  Mirror feedback.    -Seated/ standing 3# bicep curls 20x2.   Standing L/R UE (3#): sh. Shrugs/ forearm sup./pron. 20x.    No supine there.ex. today.    -Reviewed HEP.     No manual tx. Today   PATIENT EDUCATION: Education details: Pt. Educated on strength and mobility deficits found during PT evaluation. Pt. Educated on prognosis, treatment POC, and frequency of treatment. PT demonstrated HEP.  Person educated: Patient Education method: Explanation, Demonstration, Tactile cues, Verbal cues, and Handouts Education comprehension: verbalized understanding and returned  demonstration  HOME EXERCISE PROGRAM: Access Code: [email or web address] URL: [email or web address] Date: 12/31/2022 Prepared by: Dorene Grebe Exercises - Seated Shoulder Flexion AAROM with Pulley Behind - 1 x daily - 7 x weekly - 3 sets - 10 reps - Seated Shoulder Abduction AAROM with Pulley Behind - 1 x daily - 7 x weekly - 3 sets - 10 reps - Standing Shoulder Internal Rotation AAROM with Pulley - 1 x daily - 7 x weekly - 3 sets - 10 reps - Standing External  Rotation in Abduction AAROM with Pulley - 1 x daily - 7 x weekly - 3 sets - 10 reps - Supine Shoulder Flexion AAROM with Dowel - 1 x daily - 7 x weekly - 3 sets - 10 reps  01/07/2023: Access Code: [email or web address] URL: [email or web address] Date: 01/07/2023 Prepared by: Janet BerlinAmi Pathak  Exercises - Isometric Shoulder Flexion at Wall  - 1 x daily - 7 x weekly - 3 sets - 10 reps - 3secs hold - Isometric Shoulder Extension at Wall  - 1 x daily - 7 x weekly - 2 sets - 10 reps - 3secs hold - Isometric Shoulder External Rotation at Wall  - 1 x daily - 7 x weekly - 2 sets - 10 reps - 3secs hold - Isometric Shoulder Abduction at Wall  - 1 x daily - 7 x weekly - 2 sets - 10 reps - 3secs hold - Standing Isometric Shoulder Internal Rotation at Doorway  - 1 x daily - 7 x weekly - 2 sets - 10 reps - 3 secs hold ASSESSMENT:  CLINICAL IMPRESSION: Patient arrived to physical therapy reporting no shoulder pain at rest.  Pt. Continues to demonstrate limited B shoulder motion in all planes. Pt. Has considerable weakness of rotator cuff in all directions (flex, abd, IR, ER) but most especially ER/Flex. PT had long discussion with pt. About MD f/u appt. And options for lung dx./tx.  Pt. Has PET scan scheduled for May.  Pt. Will benefit from continued skilled PT treatment to continue increasing B shoulder range and strength to allow patient to complete pain-free functional movements.   OBJECTIVE IMPAIRMENTS: Abnormal gait, decreased activity tolerance,  decreased endurance, decreased mobility, decreased ROM, decreased strength, hypomobility, impaired UE functional use, and pain.   ACTIVITY LIMITATIONS: carrying, lifting, dressing, reach over head, and hygiene/grooming  PARTICIPATION LIMITATIONS: cleaning, community activity, and yard work  PERSONAL FACTORS: Age, Past/current experiences, and Time since onset of injury/illness/exacerbation are also affecting patient's functional outcome.   REHAB POTENTIAL: Good  CLINICAL DECISION MAKING: Evolving/moderate complexity  EVALUATION COMPLEXITY: Moderate  GOALS: Goals reviewed with patient Yes  SHORT TERM GOALS: Target date: 03/06/2023  Pt. Will be Independent with HEP to increase B shoulder flexion and abduction by 10 deg. To increase functional mobility of her UE's. Baseline: see above. Goal status: Partially met   LONG TERM GOALS: Target date: 03/20/2023  Pt. Will increase FOTO to 62 to improve pain-free mobility.  Baseline: 51/62.  4/10: 59 Goal status: Partially met  2.  Pt. Will report <3/10 B shoulder pain with overhead reaching/ lifting tasks.  Baseline: 10/10 NPS Goal status: Partially met  3.  PT. Will improve B shoulder strength a 1/2 muscle grade in flexion, abduction, and middle trap to improve functional movement tasks such as putting away dishes and performing hair care.  Baseline: See above. Goal status: Not met   PLAN: PT FREQUENCY: 2x/week  PT DURATION: 4 weeks  PLANNED INTERVENTIONS: Therapeutic exercises, Therapeutic activity, Neuromuscular re-education, Gait training, Patient/Family education, Self Care, Joint mobilization, Dry Needling, Cryotherapy, Moist heat, Traction, Ultrasound, and Manual therapy  PLAN FOR NEXT SESSION:  Continue strengthening program, shoulder mobilizations for mobility.    Cammie McgeeMichael C Deitrick Ferreri, PT, DPT # [phone] 02/26/2023

## 2023-02-28 ENCOUNTER — Encounter: Payer: Self-pay | Admitting: Physical Therapy

## 2023-02-28 ENCOUNTER — Ambulatory Visit: Payer: Medicare Other | Admitting: Physical Therapy

## 2023-02-28 DIAGNOSIS — M25612 Stiffness of left shoulder, not elsewhere classified: Secondary | ICD-10-CM

## 2023-02-28 DIAGNOSIS — M25611 Stiffness of right shoulder, not elsewhere classified: Secondary | ICD-10-CM | POA: Diagnosis not present

## 2023-02-28 DIAGNOSIS — M6281 Muscle weakness (generalized): Secondary | ICD-10-CM

## 2023-02-28 DIAGNOSIS — R293 Abnormal posture: Secondary | ICD-10-CM

## 2023-02-28 DIAGNOSIS — G8929 Other chronic pain: Secondary | ICD-10-CM

## 2023-02-28 NOTE — Therapy (Signed)
OUTPATIENT PHYSICAL THERAPY UPPER EXTREMITY TREATMENT   Patient Name: Virginia Pollard MRN: [id number] DOB:03/30/1946, 77 y.o., female Today's Date: 02/28/2023  END OF SESSION:  PT End of Session - 02/28/23 1431     Visit Number 12    Number of Visits 18    Date for PT Re-Evaluation 03/20/23    PT Start Time 1427    PT Stop Time 1512    PT Time Calculation (min) 45 min    Activity Tolerance Patient tolerated treatment well    Behavior During Therapy WFL for tasks assessed/performed             Past Medical History:  Diagnosis Date   Arthritis    Hyperlipidemia    Hypertension    Past Surgical History:  Procedure Laterality Date   TUBAL LIGATION     There are no problems to display for this patient.   [facility]   REFERRING PROVIDER: Virgina Organ, [facility] Provider   REFERRING DIAG: Acute pain of R shoulder    Bilateral shoulder impingment  THERAPY DIAG:  Shoulder joint stiffness, bilateral  Muscle weakness (generalized)  Chronic pain of both shoulders  Chronic left shoulder pain  Decreased range of motion of left shoulder  Abnormal posture  Rationale for Evaluation and Treatment: Rehabilitation  ONSET DATE: Chronic, recently exacerbated the last 3/4 months   SUBJECTIVE:                                                                                                                                                                                      SUBJECTIVE STATEMENT:  EVALUATION Pt. Has previous hx. Of L shoulder pain resolved with PT. Pt. Still has some stiffness in L shoulder. Pt. Reports R is c/c at this time and the pain has been severe(10/10 NPS at times). Pt. States she fell on the L shoulder 1 year ago which brought on the previous PT treatment that has since been resolved. Pt. States difficulty with any overhead activities and is limited in tasks such as hair care and reaching up to retrieve/put up  dishes.  PERTINENT HISTORY: Pt. Has hx. Of chronic shoulder pain in both the R/L shoulder. Pt. States a "black mark" was found on her lungs on a previous scan that she will be following up on with her PCP at the end of February.  Medication Sig Start Date End Date Taking? Authorizing Provider  aspirin EC 81 MG tablet Take by mouth. 03/09/18   Yes [provider]  baclofen (LIORESAL) 10 MG tablet Take 1 tablet (10 mg total) by mouth at bedtime as needed for muscle spasms.  08/08/21   Yes Shirlee Latch, PA-C  Cholecalciferol (VITAMIN D-1000 MAX ST) 25 MCG (1000 UT) tablet Take by mouth. 03/09/18   Yes [provider]  lisinopril (PRINIVIL,ZESTRIL) 10 MG tablet Take 10 mg by mouth daily.     Yes [provider]  metFORMIN (GLUCOPHAGE-XR) 500 MG 24 hr tablet Take 500 mg by mouth daily. 07/26/21   Yes [provider]  simvastatin (ZOCOR) 40 MG tablet Take 40 mg by mouth at bedtime. 07/26/21   Yes [provider]  triamcinolone ointment (KENALOG) 0.5 % Apply 1 application topically 2 (two) times daily. 08/08/21   Yes Shirlee Latch, PA-C  Biotin 1 MG CAPS Take by mouth.       [provider]  predniSONE (DELTASONE) 10 MG tablet Take 5 tabs PO on day 1 and decrease 1 tab daily until complete 08/08/21     Shirlee Latch, PA-C      Family History      Family History  Problem Relation Age of Onset   Hypertension Mother        Social History Social History        Tobacco Use   Smoking status: Never   Smokeless tobacco: Never  Vaping Use   Vaping Use: Never used  Substance Use Topics   Alcohol use: No   Drug use: No        Allergies              Patient has no known allergies.     Review of Systems Review of Systems  Constitutional: Negative.   Respiratory: Negative.    Cardiovascular: Negative.   Musculoskeletal:  Positive for myalgias. Negative for arthralgias, back pain, gait problem, joint swelling, neck pain and neck stiffness.   Skin: Negative.   Neurological: Negative.    PAIN:  Are you having pain? Yes L: 5/10 NPS R: 7/10 NPS  Pain location: B upper middle shoulder  Pain description: sharp with initiation of activity, constant dull ache Aggravating factors: reaching overhead Relieving factors: pullys, snow angels, shots, ibuprofen  Worst: 10/10 NPS Best: 4/10 NPS  PRECAUTIONS: None  WEIGHT BEARING RESTRICTIONS: No  FALLS:  Has patient fallen in last 6 months? Yes. Number of falls 1  LIVING ENVIRONMENT: Lives with: lives alone Lives in: House/apartment Stairs: No Has following equipment at home: None  OCCUPATION: Retired. Active in the church.   PLOF: Independent  PATIENT GOALS: Pt. Wants to get back to working in her yard, traveling, and live a full life without pain.   NEXT MD VISIT: 01/30/2023  OBJECTIVE:   DIAGNOSTIC FINDINGS:  See MD note  PATIENT SURVEYS :  FOTO initial 51/ goal 55  COGNITION: Overall cognitive status: Within functional limits for tasks assessed     SENSATION: WFL  POSTURE: WNL  UPPER EXTREMITY ROM:   Active ROM Right eval Left eval  Shoulder flexion 142 deg. 134 deg.  Shoulder extension [facility]  Shoulder abduction 76 deg. 79 deg.  Shoulder adduction    Shoulder internal rotation 70 deg. 51 deg.  Shoulder external rotation 69 deg. 39 deg.  Elbow flexion [facility] WFL  Elbow extension [facility]  Wrist flexion [facility] WFL  Wrist extension [facility] WFL  Wrist ulnar deviation    Wrist radial deviation    Wrist pronation    Wrist supination    (Blank rows = not tested)  Seated shoulder flexion: L: 93 deg./  R: 122 deg.  UPPER EXTREMITY MMT:  MMT  Right eval Left eval  Shoulder flexion 4-/5 4-/5  Shoulder extension    Shoulder abduction 4/5 4/5  Shoulder adduction    Shoulder internal rotation 5/5 5/5  Shoulder external rotation 4-/5 4-/5  Middle trapezius 3+/5 3+/5  Lower trapezius    Elbow flexion 4/5 4/5  Elbow extension 4/5 4/5  Wrist flexion     Wrist extension    Wrist ulnar deviation    Wrist radial deviation    Wrist pronation    Wrist supination    Grip strength (lbs)    (Blank rows = not tested)  JOINT MOBILITY TESTING:  PT performed B :  P-A- hypomobile A-P- hypomobile Inf. Glide- hypomobile  PALPATION:  PT. Noted some tenderness to palpation around both the R and L glenohumeral joint line.   01/28/23:  -Seated shoulder pulley ex.: flexion/ abduction 20x each.  Seated flexion AA/PROM: L: 127 deg./ R: 146 deg.    -Seated shoulder flexion AROM: L: 89 deg. (Slight regression/ marked muscle fatigue)/  R: 120 deg. (Increase pain).    TODAY'S TREATMENT:                                                                                                                                         DATE: 02/28/2023  Subjective: Pt. States her R shoulder ROM is doing better with warmer weather but L shoulder remains limited.     There Ex.:  -Biodex UBE bike 3 min. Fwd/ 3 min. Bkwd  -Seated pulley ex.: flexion/abduction 20x each.  Sh. ROM: L 135 deg./ R 144 deg.   -Standing wall ladder: flexion 10x with holds/ active control.    -Nautilus: seated lat. Pull downs (40#)/ standing tricep extension (30#)/ sh. Scapular retraction (40#)- 20x each.   -Seated B wand ex. (AA/PROM)- with PT assist 10x all planes.  Significant difficulty with chest press.  Moderate PT assist to manage pain/ proper technique.  Mirror feedback.    -Seated/ standing 3# bicep curls 20x2.   Standing L/R UE (3#): sh. Shrugs/ forearm sup./pron. 20x.    -Supine 3# chest press (light PT assist, esp. On L).    -Reviewed HEP.     No manual tx. Today   PATIENT EDUCATION: Education details: Pt. Educated on strength and mobility deficits found during PT evaluation. Pt. Educated on prognosis, treatment POC, and frequency of treatment. PT demonstrated HEP.  Person educated: Patient Education method: Explanation, Demonstration, Tactile cues, Verbal cues, and  Handouts Education comprehension: verbalized understanding and returned demonstration  HOME EXERCISE PROGRAM: Access Code: [email or web address] URL: [email or web address] Date: 12/31/2022 Prepared by: Dorene Grebe Exercises - Seated Shoulder Flexion AAROM with Pulley Behind - 1 x daily - 7 x weekly - 3 sets - 10 reps - Seated Shoulder Abduction AAROM with Pulley Behind - 1 x daily - 7 x weekly - 3 sets - 10 reps - Standing Shoulder Internal Rotation AAROM with Pulley -  1 x daily - 7 x weekly - 3 sets - 10 reps - Standing External Rotation in Abduction AAROM with Pulley - 1 x daily - 7 x weekly - 3 sets - 10 reps - Supine Shoulder Flexion AAROM with Dowel - 1 x daily - 7 x weekly - 3 sets - 10 reps  01/07/2023: Access Code: [email or web address] URL: [email or web address] Date: 01/07/2023 Prepared by: Janet Berlin  Exercises - Isometric Shoulder Flexion at Wall  - 1 x daily - 7 x weekly - 3 sets - 10 reps - 3secs hold - Isometric Shoulder Extension at Wall  - 1 x daily - 7 x weekly - 2 sets - 10 reps - 3secs hold - Isometric Shoulder External Rotation at Wall  - 1 x daily - 7 x weekly - 2 sets - 10 reps - 3secs hold - Isometric Shoulder Abduction at Wall  - 1 x daily - 7 x weekly - 2 sets - 10 reps - 3secs hold - Standing Isometric Shoulder Internal Rotation at Doorway  - 1 x daily - 7 x weekly - 2 sets - 10 reps - 3 secs hold ASSESSMENT:  CLINICAL IMPRESSION: Patient arrived to physical therapy reporting no shoulder pain at rest.  Pt. Continues to demonstrate limited B shoulder motion in all planes. Pt. Has considerable weakness of rotator cuff in all directions (flex, abd, IR, ER) but most especially ER/Flex. Increase in wt./ resistance with use of Nautilus today. Pt. Has PET scan scheduled for May.  Pt. Will benefit from continued skilled PT treatment to continue increasing B shoulder range and strength to allow patient to complete pain-free functional movements.   OBJECTIVE IMPAIRMENTS:  Abnormal gait, decreased activity tolerance, decreased endurance, decreased mobility, decreased ROM, decreased strength, hypomobility, impaired UE functional use, and pain.   ACTIVITY LIMITATIONS: carrying, lifting, dressing, reach over head, and hygiene/grooming  PARTICIPATION LIMITATIONS: cleaning, community activity, and yard work  PERSONAL FACTORS: Age, Past/current experiences, and Time since onset of injury/illness/exacerbation are also affecting patient's functional outcome.   REHAB POTENTIAL: Good  CLINICAL DECISION MAKING: Evolving/moderate complexity  EVALUATION COMPLEXITY: Moderate  GOALS: Goals reviewed with patient Yes  SHORT TERM GOALS: Target date: 03/06/2023  Pt. Will be Independent with HEP to increase B shoulder flexion and abduction by 10 deg. To increase functional mobility of her UE's. Baseline: see above. Goal status: Partially met   LONG TERM GOALS: Target date: 03/20/2023  Pt. Will increase FOTO to 62 to improve pain-free mobility.  Baseline: 51/62.  4/10: 59 Goal status: Partially met  2.  Pt. Will report <3/10 B shoulder pain with overhead reaching/ lifting tasks.  Baseline: 10/10 NPS Goal status: Partially met  3.  PT. Will improve B shoulder strength a 1/2 muscle grade in flexion, abduction, and middle trap to improve functional movement tasks such as putting away dishes and performing hair care.  Baseline: See above. Goal status: Not met   PLAN: PT FREQUENCY: 2x/week  PT DURATION: 4 weeks  PLANNED INTERVENTIONS: Therapeutic exercises, Therapeutic activity, Neuromuscular re-education, Gait training, Patient/Family education, Self Care, Joint mobilization, Dry Needling, Cryotherapy, Moist heat, Traction, Ultrasound, and Manual therapy  PLAN FOR NEXT SESSION:  Continue strengthening program, shoulder mobilizations for mobility.    Cammie Mcgee, PT, DPT # [phone] 02/28/2023

## 2023-07-31 DIAGNOSIS — D509 Iron deficiency anemia, unspecified: Secondary | ICD-10-CM | POA: Insufficient documentation

## 2023-07-31 DIAGNOSIS — R918 Other nonspecific abnormal finding of lung field: Secondary | ICD-10-CM | POA: Insufficient documentation

## 2023-09-01 DIAGNOSIS — C3491 Malignant neoplasm of unspecified part of right bronchus or lung: Secondary | ICD-10-CM | POA: Insufficient documentation

## 2023-10-04 ENCOUNTER — Ambulatory Visit
Admission: EM | Admit: 2023-10-04 | Discharge: 2023-10-04 | Disposition: A | Discharge status: Home / Self Care | Payer: PRIVATE HEALTH INSURANCE | Attending: Emergency Medicine | Admitting: Emergency Medicine

## 2023-10-04 ENCOUNTER — Ambulatory Visit: Payer: PRIVATE HEALTH INSURANCE

## 2023-10-04 DIAGNOSIS — S6000XA Contusion of unspecified finger without damage to nail, initial encounter: Secondary | ICD-10-CM | POA: Diagnosis not present

## 2023-10-04 DIAGNOSIS — S60211A Contusion of right wrist, initial encounter: Secondary | ICD-10-CM

## 2023-10-04 DIAGNOSIS — S60222A Contusion of left hand, initial encounter: Secondary | ICD-10-CM | POA: Diagnosis not present
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
## 2023-10-30 ENCOUNTER — Encounter: Payer: Self-pay | Admitting: Physical Therapy

## 2023-10-30 ENCOUNTER — Ambulatory Visit: Payer: Medicare Other | Attending: Physician Assistant

## 2023-10-30 DIAGNOSIS — M25611 Stiffness of right shoulder, not elsewhere classified: Secondary | ICD-10-CM | POA: Insufficient documentation

## 2023-10-30 DIAGNOSIS — M25512 Pain in left shoulder: Secondary | ICD-10-CM | POA: Insufficient documentation

## 2023-10-30 DIAGNOSIS — G8929 Other chronic pain: Secondary | ICD-10-CM | POA: Insufficient documentation

## 2023-10-30 DIAGNOSIS — M436 Torticollis: Secondary | ICD-10-CM | POA: Insufficient documentation

## 2023-10-30 DIAGNOSIS — M542 Cervicalgia: Secondary | ICD-10-CM | POA: Insufficient documentation

## 2023-10-30 DIAGNOSIS — M25612 Stiffness of left shoulder, not elsewhere classified: Secondary | ICD-10-CM | POA: Diagnosis present

## 2023-10-30 DIAGNOSIS — M6281 Muscle weakness (generalized): Secondary | ICD-10-CM | POA: Diagnosis present

## 2023-10-30 DIAGNOSIS — M25511 Pain in right shoulder: Secondary | ICD-10-CM | POA: Diagnosis present
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
## 2023-11-03 ENCOUNTER — Ambulatory Visit
Admission: RE | Admit: 2023-11-03 | Discharge: 2023-11-03 | Disposition: A | Discharge status: Home / Self Care | Payer: Medicare Other | Source: Ambulatory Visit | Attending: Family Medicine | Admitting: Family Medicine

## 2023-11-03 ENCOUNTER — Ambulatory Visit: Payer: Medicare Other | Admitting: Physical Therapy

## 2023-11-03 ENCOUNTER — Ambulatory Visit: Admission: RE | Admit: 2023-11-03 | Payer: Medicare Other | Source: Home / Self Care

## 2023-11-03 ENCOUNTER — Encounter: Payer: Self-pay | Admitting: Physical Therapy

## 2023-11-03 ENCOUNTER — Other Ambulatory Visit: Payer: Self-pay | Admitting: Family Medicine

## 2023-11-03 DIAGNOSIS — S134XXD Sprain of ligaments of cervical spine, subsequent encounter: Secondary | ICD-10-CM

## 2023-11-03 DIAGNOSIS — M6281 Muscle weakness (generalized): Secondary | ICD-10-CM

## 2023-11-03 DIAGNOSIS — M542 Cervicalgia: Secondary | ICD-10-CM

## 2023-11-03 DIAGNOSIS — M25511 Pain in right shoulder: Secondary | ICD-10-CM

## 2023-11-03 DIAGNOSIS — M436 Torticollis: Secondary | ICD-10-CM
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
## 2023-11-04 ENCOUNTER — Ambulatory Visit
Admission: RE | Admit: 2023-11-04 | Discharge: 2023-11-04 | Disposition: A | Discharge status: Home / Self Care | Payer: No Typology Code available for payment source | Attending: Family Medicine | Admitting: Family Medicine

## 2023-11-04 ENCOUNTER — Ambulatory Visit
Admission: RE | Admit: 2023-11-04 | Discharge: 2023-11-04 | Disposition: A | Discharge status: Home / Self Care | Payer: No Typology Code available for payment source | Source: Ambulatory Visit | Attending: Family Medicine | Admitting: Family Medicine

## 2023-11-04 DIAGNOSIS — S134XXD Sprain of ligaments of cervical spine, subsequent encounter: Secondary | ICD-10-CM | POA: Diagnosis present

## 2023-11-06 ENCOUNTER — Ambulatory Visit: Payer: Medicare Other | Admitting: Physical Therapy

## 2023-11-06 DIAGNOSIS — M25511 Pain in right shoulder: Secondary | ICD-10-CM

## 2023-11-06 DIAGNOSIS — M25512 Pain in left shoulder: Secondary | ICD-10-CM

## 2023-11-06 DIAGNOSIS — M542 Cervicalgia: Secondary | ICD-10-CM

## 2023-11-06 DIAGNOSIS — M436 Torticollis: Secondary | ICD-10-CM

## 2023-11-06 DIAGNOSIS — M6281 Muscle weakness (generalized): Secondary | ICD-10-CM
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
## 2023-11-10 ENCOUNTER — Ambulatory Visit: Payer: Medicare Other | Admitting: Physical Therapy

## 2023-11-10 DIAGNOSIS — M6281 Muscle weakness (generalized): Secondary | ICD-10-CM

## 2023-11-10 DIAGNOSIS — M25511 Pain in right shoulder: Secondary | ICD-10-CM

## 2023-11-10 DIAGNOSIS — M25611 Stiffness of right shoulder, not elsewhere classified: Secondary | ICD-10-CM

## 2023-11-10 DIAGNOSIS — M436 Torticollis: Secondary | ICD-10-CM

## 2023-11-10 DIAGNOSIS — G8929 Other chronic pain: Secondary | ICD-10-CM

## 2023-11-10 DIAGNOSIS — M542 Cervicalgia: Secondary | ICD-10-CM
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
## 2023-11-11 ENCOUNTER — Encounter: Payer: PRIVATE HEALTH INSURANCE | Admitting: Physical Therapy

## 2023-11-13 ENCOUNTER — Ambulatory Visit: Payer: Medicare Other | Admitting: Physical Therapy

## 2023-11-13 ENCOUNTER — Encounter: Payer: Self-pay | Admitting: Physical Therapy

## 2023-11-13 DIAGNOSIS — M25511 Pain in right shoulder: Secondary | ICD-10-CM

## 2023-11-13 DIAGNOSIS — M436 Torticollis: Secondary | ICD-10-CM

## 2023-11-13 DIAGNOSIS — M6281 Muscle weakness (generalized): Secondary | ICD-10-CM

## 2023-11-13 DIAGNOSIS — M542 Cervicalgia: Secondary | ICD-10-CM
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
## 2023-11-17 ENCOUNTER — Ambulatory Visit: Payer: Medicare Other | Admitting: Physical Therapy

## 2023-11-17 ENCOUNTER — Encounter: Payer: Self-pay | Admitting: Physical Therapy

## 2023-11-17 DIAGNOSIS — G8929 Other chronic pain: Secondary | ICD-10-CM

## 2023-11-17 DIAGNOSIS — M6281 Muscle weakness (generalized): Secondary | ICD-10-CM

## 2023-11-17 DIAGNOSIS — M25511 Pain in right shoulder: Secondary | ICD-10-CM

## 2023-11-17 DIAGNOSIS — M436 Torticollis: Secondary | ICD-10-CM

## 2023-11-17 DIAGNOSIS — M542 Cervicalgia: Secondary | ICD-10-CM

## 2023-11-17 DIAGNOSIS — M25611 Stiffness of right shoulder, not elsewhere classified: Secondary | ICD-10-CM

## 2023-11-20 ENCOUNTER — Encounter: Payer: Self-pay | Admitting: Physical Therapy

## 2023-11-20 ENCOUNTER — Ambulatory Visit: Payer: Medicare Other | Attending: Physician Assistant | Admitting: Physical Therapy

## 2023-11-20 DIAGNOSIS — M25611 Stiffness of right shoulder, not elsewhere classified: Secondary | ICD-10-CM | POA: Insufficient documentation

## 2023-11-20 DIAGNOSIS — M25512 Pain in left shoulder: Secondary | ICD-10-CM | POA: Insufficient documentation

## 2023-11-20 DIAGNOSIS — M25612 Stiffness of left shoulder, not elsewhere classified: Secondary | ICD-10-CM | POA: Diagnosis present

## 2023-11-20 DIAGNOSIS — G8929 Other chronic pain: Secondary | ICD-10-CM | POA: Insufficient documentation

## 2023-11-20 DIAGNOSIS — M6281 Muscle weakness (generalized): Secondary | ICD-10-CM | POA: Insufficient documentation

## 2023-11-20 DIAGNOSIS — M436 Torticollis: Secondary | ICD-10-CM | POA: Diagnosis present

## 2023-11-20 DIAGNOSIS — M25511 Pain in right shoulder: Secondary | ICD-10-CM | POA: Diagnosis present

## 2023-11-20 DIAGNOSIS — M542 Cervicalgia: Secondary | ICD-10-CM | POA: Insufficient documentation
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
## 2023-11-25 ENCOUNTER — Encounter: Payer: Self-pay | Admitting: Physical Therapy

## 2023-11-25 ENCOUNTER — Ambulatory Visit: Payer: Medicare Other | Admitting: Physical Therapy

## 2023-11-25 DIAGNOSIS — M436 Torticollis: Secondary | ICD-10-CM

## 2023-11-25 DIAGNOSIS — M25511 Pain in right shoulder: Secondary | ICD-10-CM | POA: Diagnosis not present

## 2023-11-25 DIAGNOSIS — M542 Cervicalgia: Secondary | ICD-10-CM

## 2023-11-25 DIAGNOSIS — M6281 Muscle weakness (generalized): Secondary | ICD-10-CM

## 2023-11-25 DIAGNOSIS — M25611 Stiffness of right shoulder, not elsewhere classified: Secondary | ICD-10-CM
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
## 2023-11-27 ENCOUNTER — Encounter: Payer: Self-pay | Admitting: Physical Therapy

## 2023-11-27 ENCOUNTER — Ambulatory Visit: Payer: Medicare Other | Admitting: Physical Therapy

## 2023-11-27 DIAGNOSIS — M436 Torticollis: Secondary | ICD-10-CM

## 2023-11-27 DIAGNOSIS — M25511 Pain in right shoulder: Secondary | ICD-10-CM

## 2023-11-27 DIAGNOSIS — M6281 Muscle weakness (generalized): Secondary | ICD-10-CM

## 2023-11-27 DIAGNOSIS — M542 Cervicalgia: Secondary | ICD-10-CM
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
## 2023-12-02 ENCOUNTER — Encounter: Payer: Self-pay | Admitting: Physical Therapy

## 2023-12-02 ENCOUNTER — Ambulatory Visit: Payer: Medicare Other | Admitting: Physical Therapy

## 2023-12-02 DIAGNOSIS — M436 Torticollis: Secondary | ICD-10-CM

## 2023-12-02 DIAGNOSIS — M25611 Stiffness of right shoulder, not elsewhere classified: Secondary | ICD-10-CM

## 2023-12-02 DIAGNOSIS — M25511 Pain in right shoulder: Secondary | ICD-10-CM | POA: Diagnosis not present

## 2023-12-02 DIAGNOSIS — M542 Cervicalgia: Secondary | ICD-10-CM

## 2023-12-02 DIAGNOSIS — G8929 Other chronic pain: Secondary | ICD-10-CM

## 2023-12-02 DIAGNOSIS — M6281 Muscle weakness (generalized): Secondary | ICD-10-CM
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
## 2023-12-04 ENCOUNTER — Ambulatory Visit: Payer: Medicare Other | Admitting: Physical Therapy

## 2023-12-04 ENCOUNTER — Encounter: Payer: Self-pay | Admitting: Physical Therapy

## 2023-12-04 DIAGNOSIS — M25611 Stiffness of right shoulder, not elsewhere classified: Secondary | ICD-10-CM

## 2023-12-04 DIAGNOSIS — M436 Torticollis: Secondary | ICD-10-CM

## 2023-12-04 DIAGNOSIS — M542 Cervicalgia: Secondary | ICD-10-CM

## 2023-12-04 DIAGNOSIS — M25511 Pain in right shoulder: Secondary | ICD-10-CM

## 2023-12-04 DIAGNOSIS — M6281 Muscle weakness (generalized): Secondary | ICD-10-CM
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
## 2023-12-09 ENCOUNTER — Ambulatory Visit: Payer: Medicare Other | Admitting: Physical Therapy

## 2023-12-09 ENCOUNTER — Encounter: Payer: Self-pay | Admitting: Physical Therapy

## 2023-12-09 DIAGNOSIS — M25511 Pain in right shoulder: Secondary | ICD-10-CM | POA: Diagnosis not present

## 2023-12-09 DIAGNOSIS — G8929 Other chronic pain: Secondary | ICD-10-CM

## 2023-12-09 DIAGNOSIS — M25611 Stiffness of right shoulder, not elsewhere classified: Secondary | ICD-10-CM

## 2023-12-09 DIAGNOSIS — M6281 Muscle weakness (generalized): Secondary | ICD-10-CM

## 2023-12-09 DIAGNOSIS — M436 Torticollis: Secondary | ICD-10-CM

## 2023-12-09 DIAGNOSIS — M542 Cervicalgia: Secondary | ICD-10-CM
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
## 2023-12-11 ENCOUNTER — Encounter: Payer: PRIVATE HEALTH INSURANCE | Admitting: Physical Therapy

## 2024-02-18 ENCOUNTER — Ambulatory Visit
Admission: EM | Admit: 2024-02-18 | Discharge: 2024-02-18 | Disposition: A | Discharge status: Home / Self Care | Attending: Physician Assistant | Admitting: Physician Assistant

## 2024-02-18 DIAGNOSIS — I10 Essential (primary) hypertension: Secondary | ICD-10-CM

## 2024-02-18 DIAGNOSIS — K047 Periapical abscess without sinus: Secondary | ICD-10-CM | POA: Diagnosis not present

## 2024-02-18 DIAGNOSIS — R519 Headache, unspecified: Secondary | ICD-10-CM

## 2024-02-18 MED ORDER — CLINDAMYCIN HCL 300 MG PO CAPS: 300.0000 mg | ORAL_CAPSULE | Freq: Two times a day (BID) | ORAL | 0 refills | Status: AC

## 2024-02-18 NOTE — ED Triage Notes (Signed)
 Pt presents to UC d/t elevated BP & HA since this AM . States highest being 181/63 around 0815. Hx of HTN. Denies any blurred vision,dizziness & photophobia.

## 2024-02-18 NOTE — Discharge Instructions (Addendum)
-  Blood pressure 150/70 today.  Continue to keep a log of her blood pressure.  May take Tylenol as needed for headache and rest. - I sent a different antibiotic for your dental infection.  May begin this tomorrow.  Stop taking the amoxicillin. - If you develop a fever or worsening symptoms please go to the ER. - If headache becomes more severe, you have increased dizziness, weakness, vision changes, difficulty speaking or walking, numbness/tingling, palpitations, chest pain, leg swelling, etc. please call 911 or go to ER. - Make a follow-up appointment with your dentist especially if your dental discomfort is not improving. - Make an appointment with your PCP if your blood pressure readings continue to stay elevated.

## 2024-02-18 NOTE — ED Provider Notes (Signed)
 [facility]    CSN: [id number] Arrival date & time: 02/18/24  0865      History   Chief Complaint Chief Complaint  Patient presents with   Hypertension    HPI Virginia Pollard is a 77 y.o. female presenting for concerns about elevated blood pressure readings today.  Patient reports waking up with a headache today. That led her to check her BP, which was 180 systolic.  BP was checked prior to taking lisinopril, which she takes in the mornings. Patient reports 50% improvement of headache after drinking fluids, eating oranges and taking BP meds. BP has come down a little to 160s systolic.   Patient recently seen by her dentist 2 days ago for dental cleaning and gingival infection.  Patient reports starting amoxicillin last night.  She believes the antibiotic could potentially be causing her current symptoms.  She does not recall the last time she took amoxicillin, if ever.  No fever, fatigue, cough, congestion, shortness of breath or exposure to flu or COVID.  Reports mild dizziness. Denies weakness, visual disturbance, chest pain, palpitations, or leg swelling.   Has had some "sinus issues."  Patient says this is due to allergies and she has been taking Zyrtec but has not taken that today.    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      Home Medications    Prior to Admission medications   Medication Sig Start Date End Date Taking? Authorizing Provider  aspirin EC 81 MG tablet Take by mouth. 03/09/18  Yes [provider]  Biotin 1 MG CAPS Take by mouth.   Yes [provider]  Cholecalciferol (VITAMIN D-1000 MAX ST) 25 MCG (1000 UT) tablet Take by mouth. 03/09/18  Yes [provider]  clindamycin (CLEOCIN) 300 MG capsule Take 1 capsule (300 mg total) by mouth 2 (two) times daily for 7 days. 02/18/24 02/25/24 Yes Eusebio Friendly B, PA-C  lisinopril (PRINIVIL,ZESTRIL) 10 MG tablet Take 10 mg by mouth daily.   Yes [provider]  metFORMIN (GLUCOPHAGE-XR) 500 MG 24 hr tablet Take 500 mg by mouth daily. 07/26/21  Yes [provider]  simvastatin (ZOCOR) 40 MG tablet Take 40 mg by mouth at bedtime. 07/26/21  Yes [provider]    Family History Family History  Problem Relation Age of Onset   Hypertension Mother     Social History Social History   Tobacco Use   Smoking status: Never    Passive exposure: Never   Smokeless tobacco: Never  Vaping Use   Vaping status: Never Used  Substance Use Topics   Alcohol use: No  Drug use: No     Allergies   Patient has no known allergies.   Review of Systems Review of Systems  Constitutional:  Negative for fatigue and fever.  HENT:  Positive for congestion and dental problem. Negative for facial swelling, rhinorrhea and sore throat.   Eyes:  Negative for visual disturbance.  Respiratory:  Negative for cough and shortness of breath.   Cardiovascular:  Negative for chest pain, palpitations and leg swelling.  Gastrointestinal:  Negative for nausea and vomiting.  Allergic/Immunologic: Positive for environmental allergies.  Neurological:  Positive for dizziness and  headaches. Negative for syncope, weakness, light-headedness and numbness.  Psychiatric/Behavioral:  Negative for confusion.      Physical Exam Triage Vital Signs ED Triage Vitals  Encounter Vitals Group     BP --      Systolic BP Percentile --      Diastolic BP Percentile --      Pulse --      Resp 02/18/24 0928 16     Temp --      Temp Source 02/18/24 0928 Oral     SpO2 --      Weight 02/18/24 0926 140 lb (63.5 kg)     Height 02/18/24 0926 5\' 3"  (1.6 m)     Head Circumference --      Peak Flow --      Pain Score 02/18/24 0931 0     Pain Loc --      Pain Education --      Exclude from Growth Chart --    No data found.  Updated Vital Signs BP (!) 150/70 (BP Location: Right Arm)   Pulse 65   Temp 98.9 F (37.2 C) (Oral)   Resp 16   Ht 5\' 3"  (1.6 m)   Wt 140 lb (63.5 kg)   SpO2 96%   BMI 24.80 kg/m   12/23/23:       119/61 11/03/23:   101/62 10/07/23:    121/59    Physical Exam Vitals and nursing note reviewed.  Constitutional:      General: She is not in acute distress.    Appearance: Normal appearance. She is not ill-appearing or toxic-appearing.  HENT:     Head: Normocephalic and atraumatic.     Nose: Nose normal.     Mouth/Throat:     Mouth: Mucous membranes are moist.     Pharynx: Oropharynx is clear.     Comments: Small area of swelling/erythema consistent with small abscess or cyst of gingiva of right upper side. This area is TTP. Eyes:     General: No scleral icterus.       Right eye: No discharge.        Left eye: No discharge.     Conjunctiva/sclera: Conjunctivae normal.  Cardiovascular:     Rate and Rhythm: Normal rate and regular rhythm.     Heart sounds: Normal heart sounds.  Pulmonary:     Effort: Pulmonary effort is normal. No respiratory distress.     Breath sounds: Normal breath sounds.  Musculoskeletal:     Cervical back: Neck supple.  Skin:    General: Skin is dry.  Neurological:     General: No focal deficit present.     Mental  Status: She is alert. Mental status is at baseline.     Motor: No weakness.     Gait: Gait normal.  Psychiatric:        Mood and Affect: Mood normal.  Behavior: Behavior normal.      UC Treatments / Results  Labs (all labs ordered are listed, but only abnormal results are displayed) Labs Reviewed - No data to display  EKG   Radiology No results found.  Procedures Procedures (including critical care time)  Medications Ordered in UC Medications - No data to display  Initial Impression / Assessment and Plan / UC Course  I have reviewed the triage vital signs and the nursing notes.  Pertinent labs & imaging results that were available during my care of the patient were reviewed by me and considered in my medical decision making (see chart for details).   78 year old female presents for headache, slight dizziness and elevated blood pressure today in the 180s systolic.  Patient provides me with a log of previous blood pressures which are all consistently below 120/80.  I also reviewed 3 previous blood pressure readings which I included above.  All within normal limits.  Reports she recently started amoxicillin for dental infection.  Seen by dentist 2 days ago.  Reports x-rays were normal but she was put on antibiotic for infection.  She does not recall the last time if ever she has taken amoxicillin and thinks it could be causing her symptoms.  Automatic blood pressure 156/79.  Manual blood pressure 150/70.  Reviewed patient's log.  Vitals are otherwise stable and normal.  Overall well-appearing.  She does have evidence of a small gingival abscess/cyst.  Chest clear.  Heart regular rate and rhythm.  I am unable to find in patient's medical records where she has had amoxicillin in the past.  She thinks it could be causing her headaches, dizziness and blood pressure to be elevated.  She would like to try a different antibiotic.  Clindamycin was sent to pharmacy.  Advised to take  Tylenol for headache, increase rest and fluids.  Continue lisinopril and keep a log of blood pressure readings.  Advised to follow-up with dentist if abscess/cyst is not improving and with PCP if blood pressure readings continue to stay elevated.  Thoroughly reviewed return and ER precautions.   Final Clinical Impressions(s) / UC Diagnoses   Final diagnoses:  Hypertension, unspecified type  Acute nonintractable headache, unspecified headache type  Dental infection     Discharge Instructions      -Blood pressure 150/70 today.  Continue to keep a log of her blood pressure.  May take Tylenol as needed for headache and rest. - I sent a different antibiotic for your dental infection.  May begin this tomorrow.  Stop taking the amoxicillin. - If you develop a fever or worsening symptoms please go to the ER. - If headache becomes more severe, you have increased dizziness, weakness, vision changes, difficulty speaking or walking, numbness/tingling, palpitations, chest pain, leg swelling, etc. please call 911 or go to ER. - Make a follow-up appointment with your dentist especially if your dental discomfort is not improving. - Make an appointment with your PCP if your blood pressure readings continue to stay elevated.     ED Prescriptions     Medication Sig Dispense Auth. Provider   clindamycin (CLEOCIN) 300 MG capsule Take 1 capsule (300 mg total) by mouth 2 (two) times daily for 7 days. 14 capsule Shirlee Latch, PA-C      PDMP not reviewed this encounter.   Shirlee Latch, PA-C 02/18/24 1009
